# Patient Record
Sex: Female | Born: 1965 | Race: Black or African American | Hispanic: No | Marital: Married | Smoking: Never smoker
Health system: Southern US, Community
[De-identification: ages and names within clinical notes are randomized; demographics above are authoritative.]

## PROBLEM LIST (undated history)

## (undated) DIAGNOSIS — I10 Essential (primary) hypertension: Secondary | ICD-10-CM

## (undated) DIAGNOSIS — M797 Fibromyalgia: Secondary | ICD-10-CM

## (undated) DIAGNOSIS — G43909 Migraine, unspecified, not intractable, without status migrainosus: Secondary | ICD-10-CM

## (undated) DIAGNOSIS — J45909 Unspecified asthma, uncomplicated: Secondary | ICD-10-CM

## (undated) HISTORY — PX: NECK SURGERY: SHX720

---

## 2016-02-04 ENCOUNTER — Ambulatory Visit
Admission: RE | Admit: 2016-02-04 | Discharge: 2016-02-04 | Disposition: A | Discharge status: Home / Self Care | Payer: Disability Insurance | Source: Ambulatory Visit | Attending: Family Medicine | Admitting: Family Medicine

## 2016-02-04 ENCOUNTER — Ambulatory Visit
Admission: RE | Admit: 2016-02-04 | Discharge: 2016-02-04 | Disposition: A | Discharge status: Home / Self Care | Payer: Disability Insurance | Source: Ambulatory Visit | Attending: Internal Medicine | Admitting: Internal Medicine

## 2016-02-04 ENCOUNTER — Other Ambulatory Visit: Payer: Self-pay | Admitting: Family Medicine

## 2016-02-04 ENCOUNTER — Other Ambulatory Visit: Payer: Self-pay | Admitting: Internal Medicine

## 2016-02-04 DIAGNOSIS — G43909 Migraine, unspecified, not intractable, without status migrainosus: Secondary | ICD-10-CM | POA: Diagnosis not present

## 2016-02-04 DIAGNOSIS — G8929 Other chronic pain: Secondary | ICD-10-CM

## 2016-02-04 DIAGNOSIS — M549 Dorsalgia, unspecified: Principal | ICD-10-CM

## 2016-02-04 DIAGNOSIS — M5136 Other intervertebral disc degeneration, lumbar region: Secondary | ICD-10-CM | POA: Insufficient documentation

## 2016-02-04 DIAGNOSIS — I10 Essential (primary) hypertension: Secondary | ICD-10-CM | POA: Insufficient documentation

## 2016-02-04 DIAGNOSIS — F3281 Premenstrual dysphoric disorder: Secondary | ICD-10-CM | POA: Diagnosis not present

## 2016-02-04 DIAGNOSIS — J45909 Unspecified asthma, uncomplicated: Secondary | ICD-10-CM | POA: Insufficient documentation

## 2016-02-04 DIAGNOSIS — M5489 Other dorsalgia: Secondary | ICD-10-CM | POA: Diagnosis present

## 2016-02-04 DIAGNOSIS — M160 Bilateral primary osteoarthritis of hip: Secondary | ICD-10-CM | POA: Diagnosis not present

## 2016-03-08 ENCOUNTER — Emergency Department
Admission: EM | Admit: 2016-03-08 | Discharge: 2016-03-08 | Disposition: A | Discharge status: Home / Self Care | Payer: Medicaid Other | Attending: Emergency Medicine | Admitting: Emergency Medicine

## 2016-03-08 ENCOUNTER — Encounter: Payer: Self-pay | Admitting: Emergency Medicine

## 2016-03-08 DIAGNOSIS — J45909 Unspecified asthma, uncomplicated: Secondary | ICD-10-CM | POA: Diagnosis not present

## 2016-03-08 DIAGNOSIS — I10 Essential (primary) hypertension: Secondary | ICD-10-CM | POA: Insufficient documentation

## 2016-03-08 DIAGNOSIS — G43909 Migraine, unspecified, not intractable, without status migrainosus: Secondary | ICD-10-CM | POA: Insufficient documentation

## 2016-03-08 HISTORY — DX: Unspecified asthma, uncomplicated: J45.909

## 2016-03-08 HISTORY — DX: Essential (primary) hypertension: I10

## 2016-03-08 MED ORDER — DIPHENHYDRAMINE HCL 50 MG/ML IJ SOLN
25.0000 mg | Freq: Once | INTRAMUSCULAR | Status: AC
  Administered 2016-03-08: 25 mg via INTRAVENOUS
  Filled 2016-03-08: qty 1

## 2016-03-08 MED ORDER — MAGNESIUM SULFATE 2 GM/50ML IV SOLN
INTRAVENOUS | Status: AC
  Administered 2016-03-08: 2 g via INTRAVENOUS
  Filled 2016-03-08: qty 50

## 2016-03-08 MED ORDER — MAGNESIUM SULFATE 2 GM/50ML IV SOLN
2.0000 g | Freq: Once | INTRAVENOUS | Status: AC
  Administered 2016-03-08: 2 g via INTRAVENOUS
  Filled 2016-03-08: qty 50

## 2016-03-08 MED ORDER — KETOROLAC TROMETHAMINE 30 MG/ML IJ SOLN
30.0000 mg | Freq: Once | INTRAMUSCULAR | Status: AC
  Administered 2016-03-08: 30 mg via INTRAVENOUS
  Filled 2016-03-08: qty 1

## 2016-03-08 MED ORDER — BUTALBITAL-APAP-CAFFEINE 50-325-40 MG PO TABS: 1.0000 | ORAL_TABLET | Freq: Four times a day (QID) | ORAL | 0 refills | Status: AC | PRN

## 2016-03-08 MED ORDER — MORPHINE SULFATE (PF) 4 MG/ML IV SOLN
INTRAVENOUS | Status: AC
  Filled 2016-03-08: qty 1

## 2016-03-08 MED ORDER — SODIUM CHLORIDE 0.9 % IV SOLN
1000.0000 mL | Freq: Once | INTRAVENOUS | Status: AC
  Administered 2016-03-08: 1000 mL via INTRAVENOUS

## 2016-03-08 MED ORDER — METOCLOPRAMIDE HCL 5 MG/ML IJ SOLN
20.0000 mg | Freq: Once | INTRAVENOUS | Status: AC
  Administered 2016-03-08: 20 mg via INTRAVENOUS
  Filled 2016-03-08: qty 4

## 2016-03-08 MED ORDER — MORPHINE SULFATE (PF) 4 MG/ML IV SOLN
4.0000 mg | Freq: Once | INTRAVENOUS | Status: AC
  Administered 2016-03-08: 4 mg via INTRAVENOUS

## 2016-03-08 NOTE — ED Notes (Signed)
Pt states migraine for 2 days, hx of migraines, photophobia and nausea, pt has sunglasses on and icepacks

## 2016-03-08 NOTE — ED Provider Notes (Signed)
[facility] Emergency Department Provider Note   ____________________________________________    I have reviewed the triage vital signs and the nursing notes.   HISTORY  Chief Complaint Migraine     HPI Tonya Hendricks is a 50 y.o. female who presents with complaints of migraine headache. Patient reports frequent headaches and today's headache is similar. She frequently requires IV medications in emergency department to feel better. She denies neurological deficits. No fevers or chills. No neck pain. She reports photophobia, mild nausea and global pounding severe headache. This is typical of her migraines her symptoms and has been occurring over the last 2 days.   Past Medical History:  Diagnosis Date  . Asthma   . Hypertension     There are no active problems to display for this patient.   History reviewed. No pertinent surgical history.  Prior to Admission medications   Medication Sig Start Date End Date Taking? Authorizing Provider  butalbital-acetaminophen-caffeine (FIORICET) [id number] MG tablet Take 1-2 tablets by mouth every 6 (six) hours as needed for headache. 03/08/16 03/08/17  Jene Everyobert Arther Heisler, MD     Allergies Aspirin; Phenergan [promethazine hcl]; and Shellfish allergy  History reviewed. No pertinent family history.  Social History Social History  Substance Use Topics  . Smoking status: Never Smoker  . Smokeless tobacco: Never Used  . Alcohol use No    Review of Systems  Constitutional: No fever/chills Eyes:Photophobia ENT: No neck pain Cardiovascular: Denies chest pain. Respiratory: Denies shortness of breath. Gastrointestinal: No abdominal pain.  No nausea, no vomiting.    Musculoskeletal: Negative for back pain. Skin: Negative for rash. Neurological: As above  10-point ROS otherwise negative.  ____________________________________________   PHYSICAL EXAM:  VITAL SIGNS: ED Triage Vitals  Enc Vitals Group   BP 03/08/16 1605 (!) 138/100     Pulse Rate 03/08/16 1605 93     Resp 03/08/16 1605 18     Temp 03/08/16 1605 98 F (36.7 C)     Temp Source 03/08/16 1605 Oral     SpO2 03/08/16 1605 98 %     Weight 03/08/16 1606 250 lb (113.4 kg)     Height 03/08/16 1606 5\' 1"  (1.549 m)     Head Circumference --      Peak Flow --      Pain Score 03/08/16 1606 8     Pain Loc --      Pain Edu? --      Excl. in GC? --     Constitutional: Alert and oriented. No acute distressBut uncomfortable, eyes covered  Head: Atraumatic.  Mouth/Throat: Mucous membranes are moist.   Neck:  Painless ROM, no meningismus or vertebral tenderness to palpation Cardiovascular: Normal rate, regular rhythm.  Respiratory: Normal respiratory effort.  No retractions.  Gastrointestinal: Soft and nontender. No distention.   Genitourinary: deferred Musculoskeletal: No lower extremity tenderness nor edema.  Warm and well perfused Neurologic:  Normal speech and language. No gross focal neurologic deficits are appreciated.  Skin:  Skin is warm, dry and intact. No rash noted. Psychiatric: Mood and affect are normal. Speech and behavior are normal.  ____________________________________________   LABS (all labs ordered are listed, but only abnormal results are displayed)  Labs Reviewed - No data to display ____________________________________________  EKG  None ____________________________________________  RADIOLOGY  None ____________________________________________   PROCEDURES  Procedure(s) performed: No    Critical Care performed: No ____________________________________________   INITIAL IMPRESSION / ASSESSMENT AND PLAN / ED COURSE  Pertinent labs &  imaging results that were available during my care of the patient were reviewed by me and considered in my medical decision making (see chart for details).  Patient presents with what she describes as her typical migraine symptoms. She is neurologically  intact. We will treat with Benadryl, Toradol, Reglan IV as well as IV fluids and reevaluate.  Clinical Course  Patient had significant improvement but continues to have a 6/7 out of 10 headache, we will try IV magnesium.  Minimal improvement from IV magnesium, patient reports excess in the past with morphine, we will try 4 mg  Patient feeling significant better after morphine, she is anxious to go home and sleep which she thinks will make her headache away fully  she knows to return if any change in her symptoms ____________________________________________   FINAL CLINICAL IMPRESSION(S) / ED DIAGNOSES  Final diagnoses:  Migraine without status migrainosus, not intractable, unspecified migraine type      NEW MEDICATIONS STARTED DURING THIS VISIT:  Discharge Medication List as of 03/08/2016  8:25 PM    START taking these medications   Details  butalbital-acetaminophen-caffeine (FIORICET) 50-325-40 MG tablet Take 1-2 tablets by mouth every 6 (six) hours as needed for headache., Starting Sun 03/08/2016, Until Mon 03/08/2017, Print         Note:  This document was prepared using Dragon voice recognition software and may include unintentional dictation errors.    Jene Every, MD 03/08/16 2102

## 2016-03-08 NOTE — ED Triage Notes (Signed)
Pt presents to ED with c/o migraine. Pt states migraine started about 2-3 days ago. Pt c/o light sensitivity. Pt states when she goes to ED in [place] she has to get shots or a bolus of fluids.

## 2016-10-02 ENCOUNTER — Emergency Department
Admission: EM | Admit: 2016-10-02 | Discharge: 2016-10-03 | Disposition: A | Discharge status: Home / Self Care | Payer: Medicaid Other | Attending: Emergency Medicine | Admitting: Emergency Medicine

## 2016-10-02 ENCOUNTER — Emergency Department: Payer: Medicaid Other

## 2016-10-02 DIAGNOSIS — I10 Essential (primary) hypertension: Secondary | ICD-10-CM | POA: Diagnosis not present

## 2016-10-02 DIAGNOSIS — K8050 Calculus of bile duct without cholangitis or cholecystitis without obstruction: Secondary | ICD-10-CM | POA: Insufficient documentation

## 2016-10-02 DIAGNOSIS — J45909 Unspecified asthma, uncomplicated: Secondary | ICD-10-CM | POA: Insufficient documentation

## 2016-10-02 DIAGNOSIS — Z79899 Other long term (current) drug therapy: Secondary | ICD-10-CM | POA: Insufficient documentation

## 2016-10-02 DIAGNOSIS — Z7982 Long term (current) use of aspirin: Secondary | ICD-10-CM | POA: Diagnosis not present

## 2016-10-02 DIAGNOSIS — R079 Chest pain, unspecified: Secondary | ICD-10-CM | POA: Diagnosis present

## 2016-10-02 DIAGNOSIS — K8080 Other cholelithiasis without obstruction: Secondary | ICD-10-CM

## 2016-10-02 HISTORY — DX: Fibromyalgia: M79.7

## 2016-10-02 LAB — BASIC METABOLIC PANEL
ANION GAP: 7 (ref 5–15)
BUN: 13 mg/dL (ref 6–20)
CALCIUM: 9.3 mg/dL (ref 8.9–10.3)
CO2: 24 mmol/L (ref 22–32)
CREATININE: 0.71 mg/dL (ref 0.44–1.00)
Chloride: 106 mmol/L (ref 101–111)
GFR calc Af Amer: >60 mL/min (ref >60)
Glucose, Bld: 86 mg/dL (ref 65–99)
Potassium: 3.8 mmol/L (ref 3.5–5.1)
SODIUM: 137 mmol/L (ref 135–145)

## 2016-10-02 LAB — HEPATIC FUNCTION PANEL
ALK PHOS: 58 U/L (ref 38–126)
ALT: 14 U/L (ref 14–54)
AST: 19 U/L (ref 15–41)
Albumin: 3.7 g/dL (ref 3.5–5.0)
TOTAL PROTEIN: 7.7 g/dL (ref 6.5–8.1)
Total Bilirubin: 0.5 mg/dL (ref 0.3–1.2)

## 2016-10-02 LAB — TROPONIN I: Troponin I: <0.03 ng/mL (ref <0.03)

## 2016-10-02 LAB — CBC
HCT: 43 % (ref 35.0–47.0)
Hemoglobin: 14.3 g/dL (ref 12.0–16.0)
MCH: 27.5 pg (ref 26.0–34.0)
MCHC: 33.3 g/dL (ref 32.0–36.0)
MCV: 82.6 fL (ref 80.0–100.0)
PLATELETS: 170 10*3/uL (ref 150–440)
RBC: 5.21 MIL/uL — AB (ref 3.80–5.20)
RDW: 15.4 % — ABNORMAL HIGH (ref 11.5–14.5)
WBC: 9.5 10*3/uL (ref 3.6–11.0)

## 2016-10-02 LAB — LIPASE, BLOOD: Lipase: 29 U/L (ref 11–51)

## 2016-10-02 MED ORDER — MORPHINE SULFATE (PF) 4 MG/ML IV SOLN
4.0000 mg | Freq: Once | INTRAVENOUS | Status: AC
  Administered 2016-10-02: 4 mg via INTRAMUSCULAR

## 2016-10-02 MED ORDER — DIPHENHYDRAMINE HCL 25 MG PO CAPS
ORAL_CAPSULE | ORAL | Status: DC
Start: 2016-10-02 — End: 2016-10-03
  Filled 2016-10-02: qty 1

## 2016-10-02 MED ORDER — OXYCODONE-ACETAMINOPHEN 5-325 MG PO TABS: 1.0000 | ORAL_TABLET | ORAL | 0 refills | Status: DC | PRN

## 2016-10-02 MED ORDER — MORPHINE SULFATE (PF) 4 MG/ML IV SOLN
4.0000 mg | Freq: Once | INTRAVENOUS | Status: DC
Start: 2016-10-02 — End: 2016-10-02
  Filled 2016-10-02: qty 1

## 2016-10-02 MED ORDER — DIPHENHYDRAMINE HCL 25 MG PO CAPS
25.0000 mg | ORAL_CAPSULE | Freq: Once | ORAL | Status: AC
  Administered 2016-10-02: 25 mg via ORAL

## 2016-10-02 MED ORDER — ONDANSETRON HCL 4 MG PO TABS: 4.0000 mg | ORAL_TABLET | Freq: Three times a day (TID) | ORAL | 0 refills | Status: AC | PRN

## 2016-10-02 MED ORDER — OXYCODONE-ACETAMINOPHEN 5-325 MG PO TABS
1.0000 | ORAL_TABLET | Freq: Once | ORAL | Status: AC
  Administered 2016-10-02: 1 via ORAL
  Filled 2016-10-02: qty 1

## 2016-10-02 MED ORDER — MORPHINE SULFATE (PF) 4 MG/ML IV SOLN
INTRAVENOUS | Status: AC
  Administered 2016-10-02: 4 mg via INTRAMUSCULAR
  Filled 2016-10-02: qty 1

## 2016-10-02 NOTE — ED Provider Notes (Addendum)
[facility] Emergency Department Provider Note  ____________________________________________  7 PM  I have reviewed the triage vital signs and the nursing notes.   HISTORY  Chief Complaint Chest Pain   HPI Tonya Hendricks is a 51 y.o. female here for evaluation of chest pain  2 days of chest pain. Somewhat worse with inspiration, walking and especially movement and twisting. Primarily mid chest with radiation towards the right side. No nausea or vomiting. No fevers or chills. No abdominal pain. No worsening after eating. Pain is been steadily worsening for the last day. Denies previous known cardiac history, who reports for a while she was having regular testing done because of exposure to Fen-Fen.  No leg swelling. No history of blood clots. Does not smoke. Does not take any estrogens. Denies pregnancy, prior endometrial ablation. No recent trips or travel. No recent surgeries.  No ripping tearing or moving pain. No pain into the back, pain does seem to slightly radiate across the front chest toward her right shoulder at times. Feels slightly short of breath when the pain "takes her breath away" but denies feeling like she is actually short of breath.  Patient has been taken baby aspirin daily the last couple days to chest pain. Reports she has an aspirin allergy when she takes higher doses it causes as ulcers to appear on her fingers  Past Medical History:  Diagnosis Date  . Asthma   . Fibromyalgia   . Hypertension     There are no active problems to display for this patient.   Past Surgical History:  Procedure Laterality Date  . CESAREAN SECTION    . NECK SURGERY      Prior to Admission medications   Medication Sig Start Date End Date Taking? Authorizing Provider  albuterol (PROVENTIL HFA;VENTOLIN HFA) 108 (90 Base) MCG/ACT inhaler Inhale 1 puff into the lungs every 6 (six) hours as needed for wheezing or shortness of breath.   Yes Historical  Provider, MD  amLODipine (NORVASC) 10 MG tablet Take 10 mg by mouth daily.   Yes Historical Provider, MD  aspirin EC 81 MG tablet Take 162 mg by mouth every other day.   Yes Historical Provider, MD  butalbital-acetaminophen-caffeine (FIORICET) 50-325-40 MG tablet Take 1-2 tablets by mouth every 6 (six) hours as needed for headache. 03/08/16 03/08/17 Yes Jene Every, MD  Cholecalciferol (VITAMIN D-1000 MAX ST) 1000 units tablet Take 1,000 mg by mouth as needed. 04/05/15  Yes Historical Provider, MD  FLUoxetine (PROZAC) 20 MG capsule Take 60 mg by mouth daily.   Yes Historical Provider, MD  Fluticasone-Salmeterol (ADVAIR) 500-50 MCG/DOSE AEPB Inhale 2 puffs into the lungs daily.   Yes Historical Provider, MD  gabapentin (NEURONTIN) 300 MG capsule Take 300 mg by mouth 3 (three) times daily as needed.   Yes Historical Provider, MD  hydrOXYzine (ATARAX/VISTARIL) 25 MG tablet Take 25 mg by mouth 3 (three) times daily as needed. 09/13/13  Yes Historical Provider, MD  naproxen (NAPROSYN) 500 MG tablet Take 500 mg by mouth 2 (two) times daily with a meal.   Yes Historical Provider, MD  omeprazole (PRILOSEC) 20 MG capsule Take 20 mg by mouth as needed.   Yes Historical Provider, MD  ranitidine (ZANTAC) 150 MG tablet Take 150 mg by mouth as needed for heartburn.   Yes Historical Provider, MD    Allergies Aspirin; Phenergan [promethazine hcl]; and Shellfish allergy  No family history on file.  Social History Social History  Substance Use Topics  .  Smoking status: Never Smoker  . Smokeless tobacco: Never Used  . Alcohol use No    Review of Systems Constitutional: No fever/chills Eyes: No visual changes. ENT: No sore throat. Cardiovascular: Denies chest pain. Respiratory: Denies shortness of breath. Gastrointestinal: No abdominal pain.  No nausea, no vomiting.  No diarrhea.  No constipation. Genitourinary: Negative for dysuria. Musculoskeletal: Negative for back pain. Skin: Negative for  rash. Neurological: Negative for headaches, focal weakness or numbness.  10-point ROS otherwise negative.  ____________________________________________   PHYSICAL EXAM:  VITAL SIGNS: ED Triage Vitals  Enc Vitals Group     BP 10/02/16 1846 (!) 162/90     Pulse Rate 10/02/16 1842 90     Resp 10/02/16 1842 18     Temp 10/02/16 1842 98.1 F (36.7 C)     Temp Source 10/02/16 1842 Oral     SpO2 10/02/16 1842 100 %     Weight 10/02/16 1843 264 lb (119.7 kg)     Height 10/02/16 1843  (1.549 m)     Head Circumference --      Peak Flow --      Pain Score 10/02/16 1842 6     Pain Loc --      Pain Edu? --      Excl. in GC? --     Constitutional: Alert and oriented. Well appearing and in no acute distress.Very pleasant. Eyes: Conjunctivae are normal. PERRL. EOMI. Head: Atraumatic. Nose: No congestion/rhinnorhea. Mouth/Throat: Mucous membranes are moist.  Oropharynx non-erythematous. Neck: No stridor.   Cardiovascular: Normal rate, regular rhythm. Grossly normal heart sounds.  Good peripheral circulation.Patient has mild tenderness to palpation across the right anterior chest wall. Respiratory: Normal respiratory effort.  No retractions. Lungs CTAB. Gastrointestinal: Soft and nontender. Negative Murphy No distention. Somewhat obese. Musculoskeletal: No lower extremity tenderness nor edema.   Neurologic:  Normal speech and language. No gross focal neurologic deficits are appreciated. No gait instability. Skin:  Skin is warm, dry and intact. No rash noted. Psychiatric: Mood and affect are normal. Speech and behavior are normal.  ____________________________________________   LABS (all labs ordered are listed, but only abnormal results are displayed)  Labs Reviewed  CBC - Abnormal; Notable for the following:       Result Value   RBC 5.21 (*)    RDW 15.4 (*)    All other components within normal limits  HEPATIC FUNCTION PANEL - Abnormal; Notable for the following:     Bilirubin, Direct <0.1 (*)    All other components within normal limits  BASIC METABOLIC PANEL  TROPONIN I  LIPASE, BLOOD  TROPONIN I   ____________________________________________  EKG  Reviewed injury by me at 1840 Ventricular rate 90 QRS 70 QTc 440 Normal sinus rhythm, minimal T-wave abnormality Versus slight artifact including a slight depression is noted in aVF, some nonspecific flattening in V2. No clear evidence of ischemia. ____________________________________________  RADIOLOGY  Dg Chest 2 View  Result Date: 10/02/2016 CLINICAL DATA:  Epigastric pain and chest pain radiating to the right shoulder EXAM: CHEST  2 VIEW COMPARISON:  None. FINDINGS: The heart size and mediastinal contours are within normal limits. Both lungs are clear. The visualized skeletal structures are unremarkable. IMPRESSION: No active cardiopulmonary disease. Electronically Signed   By: Deatra Robinson M.D.   On: 10/02/2016 20:07    ____________________________________________   PROCEDURES  Procedure(s) performed: None  Procedures  Critical Care performed: No  ____________________________________________   INITIAL IMPRESSION / ASSESSMENT AND PLAN / ED COURSE  Pertinent labs & imaging results that were available during my care of the patient were reviewed by me and considered in my medical decision making (see chart for details).  Patient admits for evaluation of right-sided chest discomfort, some substernal component, but also a slight pleuritic and very musculoskeletal component that anytime she goes to move she reports discomfort across the right side of the chest.        Pulmonary Embolism Rule-out Criteria (PERC rule)                        If YES to ANY of the following, the PERC rule is not satisfied and cannot be used to rule out PE in this patient (consider d-dimer or imaging depending on pre-test probability).                      If NO to ALL of the following, AND the  clinician's pre-test probability is <15%, the [facility] rule is satisfied and there is no need for further workup (including no need to obtain a d-dimer) as the post-test probability of pulmonary embolism is <2%.                      Mnemonic is HAD CLOTS   H - hormone use (exogenous estrogen)      No. A - age > 50                                                 No. D - DVT/PE history                                      No.   C - coughing blood (hemoptysis)                 No. L - leg swelling, unilateral                             No. O - O2 Sat on Room Air < 95%                  No. T - tachycardia (HR ? 100)                         No. S - surgery or trauma, recent                      No.   Based on my evaluation of the patient, including application of this decision instrument, further testing to evaluate for pulmonary embolism is not indicated at this time.   ----------------------------------------- 7:29 PM on 10/02/2016 -----------------------------------------  Patient troponin negative.  ----------------------------------------- 8:36 PM on 10/02/2016 -----------------------------------------  Patient reports pain improving. Resting comfortably. I did discuss and offered admission to "rule out heart attack", after reviewing though the patient reports that for blood work is normal she was to be discharged and will follow-up. Otherwise did recommend observation, I think this is reasonable and we spent time discussing her goals of care, desire to be at home, and plan for close follow-up and return of clear full return precautions. She has  had symptoms for 2 days nurse troponin is negative I find it highly likely this represents acute coronary syndrome, and her EKG does not appear acutely ischemic. Did discuss with her husband, both are comfortable with plan for discharge of second troponin negative. In addition given her right sided discomfort all [place] quadrant is been  ordered  Plan of care assigned to Dr. Derrill Kay, likely discharge to home with close cardiology follow-up if right upper quadrant ultrasound and second troponin are normal.  ____________________________________________   FINAL CLINICAL IMPRESSION(S) / ED DIAGNOSES  Final diagnoses:  Right-sided chest pain      NEW MEDICATIONS STARTED DURING THIS VISIT:  New Prescriptions   No medications on file     Note:  This document was prepared using Dragon voice recognition software and may include unintentional dictation errors.     Sharyn Creamer, MD 10/02/16 2038    Sharyn Creamer, MD 10/02/16 2125

## 2016-10-02 NOTE — ED Notes (Signed)
Patient transported to Ultrasound 

## 2016-10-02 NOTE — Discharge Instructions (Addendum)
Please seek medical attention for any high fevers, chest pain, shortness of breath, change in behavior, persistent vomiting, bloody stool or any other new or concerning symptoms.  

## 2016-10-02 NOTE — ED Notes (Signed)
Pt. States epigastric pain for the past couple days.  Pt. States rt. Side chest pain that radiates to rt. Shoulder.

## 2016-10-02 NOTE — ED Triage Notes (Addendum)
Pt reports to ED w/ c/o CP and SOB.  Pt sts CP began 3 days ago, she thought that it was gas but pain worsening. Pt A/OX4, able to answer a;; questions w/o issue.  Pt ambulatory w/o issue.  Pt reports +PO intake. NAD. Pt sts pain radiates to back and R arm, feels "like my lungs are hurting"

## 2016-10-02 NOTE — ED Provider Notes (Signed)
-----------------------------------------   11:50 PM on 10/02/2016 -----------------------------------------  RUQ US IMPRESSION: Moderate cholelithiasis. No additional sonographic evidence to suggest cholecystitis.  2nd troponin negative. Will trial PO and oral pain medication. Will give surgery follow up.   Phineas Semen, MD 10/02/16 [phone]

## 2016-10-03 NOTE — ED Notes (Signed)
Pt. Going home with family. 

## 2016-10-06 ENCOUNTER — Telehealth: Payer: Self-pay

## 2016-10-06 NOTE — Telephone Encounter (Signed)
Lmov for patient to call back she was seen in ED on 10/02/16 for CP  °Will try again at a later time  °

## 2016-10-09 NOTE — Telephone Encounter (Signed)
Called patient she states she will follow back up with her other cardiologist

## 2016-11-02 ENCOUNTER — Inpatient Hospital Stay: Payer: Self-pay | Admitting: Surgery

## 2017-02-13 ENCOUNTER — Emergency Department
Admission: EM | Admit: 2017-02-13 | Discharge: 2017-02-13 | Disposition: A | Discharge status: Home / Self Care | Payer: Medicaid Other | Attending: Emergency Medicine | Admitting: Emergency Medicine

## 2017-02-13 DIAGNOSIS — J45909 Unspecified asthma, uncomplicated: Secondary | ICD-10-CM | POA: Insufficient documentation

## 2017-02-13 DIAGNOSIS — G43009 Migraine without aura, not intractable, without status migrainosus: Secondary | ICD-10-CM

## 2017-02-13 DIAGNOSIS — Z79899 Other long term (current) drug therapy: Secondary | ICD-10-CM | POA: Diagnosis not present

## 2017-02-13 DIAGNOSIS — I10 Essential (primary) hypertension: Secondary | ICD-10-CM | POA: Insufficient documentation

## 2017-02-13 DIAGNOSIS — G43909 Migraine, unspecified, not intractable, without status migrainosus: Secondary | ICD-10-CM | POA: Diagnosis present

## 2017-02-13 HISTORY — DX: Migraine, unspecified, not intractable, without status migrainosus: G43.909

## 2017-02-13 MED ORDER — ONDANSETRON 4 MG PO TBDP
ORAL_TABLET | ORAL | Status: DC
Start: 2017-02-13 — End: 2017-02-13
  Filled 2017-02-13: qty 1

## 2017-02-13 MED ORDER — METOCLOPRAMIDE HCL 5 MG/ML IJ SOLN
10.0000 mg | Freq: Once | INTRAMUSCULAR | Status: AC
  Administered 2017-02-13: 10 mg via INTRAVENOUS
  Filled 2017-02-13: qty 2

## 2017-02-13 MED ORDER — SODIUM CHLORIDE 0.9 % IV BOLUS (SEPSIS)
1000.0000 mL | Freq: Once | INTRAVENOUS | Status: AC
  Administered 2017-02-13: 1000 mL via INTRAVENOUS

## 2017-02-13 MED ORDER — DIPHENHYDRAMINE HCL 50 MG/ML IJ SOLN
25.0000 mg | Freq: Once | INTRAMUSCULAR | Status: AC
Start: 2017-02-13 — End: 2017-02-13
  Administered 2017-02-13: 25 mg via INTRAVENOUS
  Filled 2017-02-13: qty 1

## 2017-02-13 MED ORDER — MORPHINE SULFATE (PF) 4 MG/ML IV SOLN
4.0000 mg | Freq: Once | INTRAVENOUS | Status: AC
  Administered 2017-02-13: 4 mg via INTRAVENOUS
  Filled 2017-02-13: qty 1

## 2017-02-13 MED ORDER — METOCLOPRAMIDE HCL 10 MG PO TABS: 10.0000 mg | ORAL_TABLET | Freq: Three times a day (TID) | ORAL | 0 refills | Status: DC | PRN

## 2017-02-13 MED ORDER — ONDANSETRON 4 MG PO TBDP
4.0000 mg | ORAL_TABLET | Freq: Once | ORAL | Status: AC
  Administered 2017-02-13: 4 mg via ORAL

## 2017-02-13 MED ORDER — KETOROLAC TROMETHAMINE 30 MG/ML IJ SOLN
30.0000 mg | Freq: Once | INTRAMUSCULAR | Status: AC
  Administered 2017-02-13: 30 mg via INTRAVENOUS
  Filled 2017-02-13: qty 1

## 2017-02-13 NOTE — ED Triage Notes (Signed)
Pt arrives to ER via POV for migraine X 3 days, nausea and photosensitivity, pain in neck and shoulders. Hx of migraines. Pt alert and oriented X4, active, cooperative, pt in NAD. RR even and unlabored, color WNL.

## 2017-02-13 NOTE — Discharge Instructions (Signed)
He may take the Reglan as needed for nausea or recurrent headache at home in addition to her normal medications. Follow-up with your regular doctor. Return to the ER for new or worsening headache or other new or worsening symptoms that concern you.

## 2017-02-13 NOTE — ED Provider Notes (Signed)
[facility] Emergency Department Provider Note ____________________________________________   First MD Initiated Contact with Patient 02/13/17 1520     (approximate)  I have reviewed the triage vital signs and the nursing notes.   HISTORY  Chief Complaint Migraine    HPI Tonya Hendricks is a 51 y.o. female with history of migraines, hypertension, fibromyalgia, asthma, who presents with headache for 3 days, acute onset but then worsening course, primarily right-sided, throbbing, and associated with nausea and several episodes of vomiting. Patient also reports associated photophobia. She states that the headache is identical to prior migraines although somewhat more focused on the right side. No head trauma. No vision changes. Not relieved with over-the-counter medications at home.  Past Medical History:  Diagnosis Date  . Asthma   . Fibromyalgia   . Hypertension   . Migraines     There are no active problems to display for this patient.   Past Surgical History:  Procedure Laterality Date  . CESAREAN SECTION    . NECK SURGERY      Prior to Admission medications   Medication Sig Start Date End Date Taking? Authorizing Provider  amLODipine (NORVASC) 10 MG tablet Take 10 mg by mouth daily.   Yes [provider]  DULoxetine (CYMBALTA) 30 MG capsule Take 1 capsule by mouth daily. 09/07/16  Yes [provider]  hydrochlorothiazide (HYDRODIURIL) 25 MG tablet Take 1 tablet by mouth daily. 06/04/16  Yes [provider]  hydrOXYzine (ATARAX/VISTARIL) 25 MG tablet Take 25 mg by mouth 3 (three) times daily as needed. 09/13/13  Yes [provider]  lovastatin (MEVACOR) 20 MG tablet Take 20 mg by mouth daily. 12/22/16 12/22/17 Yes [provider]  magnesium oxide (MAG-OX) 400 MG tablet Take 1 tablet by mouth daily. 06/03/15  Yes [provider]  albuterol (PROVENTIL HFA;VENTOLIN HFA) 108 (90 Base) MCG/ACT inhaler  Inhale 1 puff into the lungs every 6 (six) hours as needed for wheezing or shortness of breath.    [provider]  butalbital-acetaminophen-caffeine (FIORICET) 50-325-40 MG tablet Take 1-2 tablets by mouth every 6 (six) hours as needed for headache. Patient not taking: Reported on 02/13/2017 03/08/16 03/08/17  Jene Every, MD  cyclobenzaprine (FLEXERIL) 5 MG tablet Take 5 mg by mouth 3 (three) times daily. 01/24/16   [provider]  gabapentin (NEURONTIN) 300 MG capsule Take 300 mg by mouth 3 (three) times daily as needed.    [provider]  meclizine (ANTIVERT) 12.5 MG tablet Take 12.5 mg by mouth 3 (three) times daily. 01/24/16   [provider]  metoCLOPramide (REGLAN) 10 MG tablet Take 1 tablet (10 mg total) by mouth every 8 (eight) hours as needed for nausea or vomiting. 02/13/17 02/23/17  Dionne Bucy, MD  omeprazole (PRILOSEC) 20 MG capsule Take 20 mg by mouth as needed.    [provider]  ondansetron (ZOFRAN) 4 MG tablet Take 1 tablet (4 mg total) by mouth every 8 (eight) hours as needed for nausea or vomiting. Patient not taking: Reported on 02/13/2017 10/02/16   Phineas Semen, MD  oxyCODONE-acetaminophen (ROXICET) 5-325 MG tablet Take 1 tablet by mouth every 4 (four) hours as needed for severe pain. Patient not taking: Reported on 02/13/2017 10/02/16   Phineas Semen, MD  ranitidine (ZANTAC) 150 MG tablet Take 150 mg by mouth as needed for heartburn.    [provider]    Allergies Promethazine; Shellfish-derived products; Sulfa antibiotics; Sumatriptan succinate; Iodinated diagnostic agents; Lisinopril; Phenergan [promethazine hcl]; Shellfish  allergy; Topiramate; and Aspirin  No family history on file.  Social History Social History  Substance Use Topics  . Smoking status: Never Smoker  . Smokeless tobacco: Never Used  . Alcohol use No    Review of Systems  Constitutional: No fever/chills Eyes: No visual  changes. ENT: No sore throat. Cardiovascular: Denies chest pain. Respiratory: Denies shortness of breath. Gastrointestinal: Positive for nausea and vomiting No diarrhea.  Genitourinary: Negative for dysuria.  Musculoskeletal: Negative for back pain. Skin: Negative for rash. Neurological: Positive for headache, negative for focal weakness or numbness.   ____________________________________________   PHYSICAL EXAM:  VITAL SIGNS: ED Triage Vitals [02/13/17 1233]  Enc Vitals Group     BP (!) 156/95     Pulse Rate 88     Resp 18     Temp 98.4 F (36.9 C)     Temp Source Oral     SpO2 97 %     Weight 264 lb (119.7 kg)     Height 5\' 1"  (1.549 m)     Head Circumference      Peak Flow      Pain Score 9     Pain Loc      Pain Edu?      Excl. in GC?     Constitutional: Alert and oriented. Well appearing and in no acute distress. Eyes: Conjunctivae are normal. EOMI. PERRLA. Head: Atraumatic. Nose: No congestion/rhinnorhea. Mouth/Throat: Mucous membranes are moist.   Neck: Normal range of motion. No meningeal signs. Cardiovascular:   Good peripheral circulation. Respiratory: Normal respiratory effort.  No retractions.  Gastrointestinal: No distention.  Genitourinary: No CVA tenderness. Musculoskeletal: No lower extremity edema.  Extremities warm and well perfused.  Neurologic:  Normal speech and language. No gross focal neurologic deficits are appreciated. Renal nerves III through XII intact, normal coordination and finger-to-nose. Skin:  Skin is warm and dry. No rash noted. Psychiatric: Mood and affect are normal. Speech and behavior are normal.  ____________________________________________   LABS (all labs ordered are listed, but only abnormal results are displayed)  Labs Reviewed - No data to  display ____________________________________________  EKG   ____________________________________________  RADIOLOGY    ____________________________________________   PROCEDURES  Procedure(s) performed: No    Critical Care performed: No ____________________________________________   INITIAL IMPRESSION / ASSESSMENT AND PLAN / ED COURSE  Pertinent labs & imaging results that were available during my care of the patient were reviewed by me and considered in my medical decision making (see chart for details).  51 year old female with history of migraines presents with headache for 3 days worsening course identical to prior migraines. Not relieved with over-the-counter meds at home. While signs are unremarkable except for slight hypertension, and neuro exam is normal. Presentation consistent with patient's chronic migraines. Plan for symptomatic treatment with fluids Reglan and Toradol and Benadryl and reassess.    ----------------------------------------- 7:10 PM on 02/13/2017 -----------------------------------------  Patient had partial relief with Reglan and Toradol but reported persistent headache. Now after small dose of IV morphine and she feels significantly better. Photophobia is resolved and patient feels well to go home. Return precautions given.  ____________________________________________   FINAL CLINICAL IMPRESSION(S) / ED DIAGNOSES  Final diagnoses:  Migraine without aura and without status migrainosus, not intractable      NEW MEDICATIONS STARTED DURING THIS VISIT:  Discharge Medication List as of 02/13/2017  7:12 PM    START taking these medications   Details  metoCLOPramide (REGLAN) 10 MG tablet Take 1 tablet (10  mg total) by mouth every 8 (eight) hours as needed for nausea or vomiting., Starting Sat 02/13/2017, Until Tue 02/23/2017, Print         Note:  This document was prepared using Dragon voice recognition software and may include  unintentional dictation errors.    Dionne Bucy, MD 02/13/17 2133

## 2017-10-01 ENCOUNTER — Ambulatory Visit: Payer: Medicaid Other | Admitting: Physical Therapy

## 2017-10-11 ENCOUNTER — Encounter: Payer: Self-pay | Admitting: Physical Therapy

## 2017-10-13 ENCOUNTER — Encounter: Payer: Medicaid Other | Admitting: Physical Therapy

## 2017-10-19 ENCOUNTER — Encounter: Payer: Medicaid Other | Admitting: Physical Therapy

## 2017-10-26 ENCOUNTER — Ambulatory Visit: Payer: Medicaid Other | Admitting: Physical Therapy

## 2017-10-26 ENCOUNTER — Encounter: Payer: Medicaid Other | Admitting: Physical Therapy

## 2017-11-01 ENCOUNTER — Encounter: Payer: Medicaid Other | Admitting: Physical Therapy

## 2017-11-02 ENCOUNTER — Encounter: Payer: Medicaid Other | Admitting: Physical Therapy

## 2017-11-03 ENCOUNTER — Encounter: Payer: Medicaid Other | Admitting: Physical Therapy

## 2018-04-20 ENCOUNTER — Emergency Department: Payer: Medicaid Other

## 2018-04-20 ENCOUNTER — Other Ambulatory Visit: Payer: Self-pay

## 2018-04-20 ENCOUNTER — Emergency Department
Admission: EM | Admit: 2018-04-20 | Discharge: 2018-04-20 | Disposition: A | Discharge status: Home / Self Care | Payer: Medicaid Other | Attending: Emergency Medicine | Admitting: Emergency Medicine

## 2018-04-20 ENCOUNTER — Encounter: Payer: Self-pay | Admitting: Emergency Medicine

## 2018-04-20 DIAGNOSIS — Z79899 Other long term (current) drug therapy: Secondary | ICD-10-CM | POA: Insufficient documentation

## 2018-04-20 DIAGNOSIS — J45909 Unspecified asthma, uncomplicated: Secondary | ICD-10-CM | POA: Insufficient documentation

## 2018-04-20 DIAGNOSIS — R079 Chest pain, unspecified: Secondary | ICD-10-CM

## 2018-04-20 DIAGNOSIS — J069 Acute upper respiratory infection, unspecified: Secondary | ICD-10-CM | POA: Diagnosis not present

## 2018-04-20 DIAGNOSIS — I10 Essential (primary) hypertension: Secondary | ICD-10-CM | POA: Diagnosis not present

## 2018-04-20 LAB — CBC WITH DIFFERENTIAL/PLATELET
Abs Immature Granulocytes: 0.03 10*3/uL (ref 0.00–0.07)
BASOS ABS: 0.1 10*3/uL (ref 0.0–0.1)
BASOS PCT: 1 %
EOS ABS: 0.3 10*3/uL (ref 0.0–0.5)
EOS PCT: 3 %
HEMATOCRIT: 39.8 % (ref 36.0–46.0)
Hemoglobin: 13.1 g/dL (ref 12.0–15.0)
IMMATURE GRANULOCYTES: 0 %
LYMPHS ABS: 3 10*3/uL (ref 0.7–4.0)
Lymphocytes Relative: 35 %
MCH: 27.3 pg (ref 26.0–34.0)
MCHC: 32.9 g/dL (ref 30.0–36.0)
MCV: 83.1 fL (ref 80.0–100.0)
Monocytes Absolute: 0.5 10*3/uL (ref 0.1–1.0)
Monocytes Relative: 6 %
NEUTROS ABS: 4.8 10*3/uL (ref 1.7–7.7)
NEUTROS PCT: 55 %
NRBC: 0 % (ref 0.0–0.2)
PLATELETS: 165 10*3/uL (ref 150–400)
RBC: 4.79 MIL/uL (ref 3.87–5.11)
RDW: 16.1 % — AB (ref 11.5–15.5)
WBC: 8.6 10*3/uL (ref 4.0–10.5)

## 2018-04-20 LAB — COMPREHENSIVE METABOLIC PANEL
ALBUMIN: 3.4 g/dL — AB (ref 3.5–5.0)
ALT: 17 U/L (ref 0–44)
AST: 14 U/L — AB (ref 15–41)
Alkaline Phosphatase: 72 U/L (ref 38–126)
Anion gap: 4 — ABNORMAL LOW (ref 5–15)
BILIRUBIN TOTAL: 0.5 mg/dL (ref 0.3–1.2)
BUN: 13 mg/dL (ref 6–20)
CHLORIDE: 104 mmol/L (ref 98–111)
CO2: 30 mmol/L (ref 22–32)
Calcium: 8.8 mg/dL — ABNORMAL LOW (ref 8.9–10.3)
Creatinine, Ser: 0.67 mg/dL (ref 0.44–1.00)
GFR calc Af Amer: >60 mL/min (ref >60)
GFR calc non Af Amer: >60 mL/min (ref >60)
GLUCOSE: 118 mg/dL — AB (ref 70–99)
POTASSIUM: 3.3 mmol/L — AB (ref 3.5–5.1)
SODIUM: 138 mmol/L (ref 135–145)
TOTAL PROTEIN: 7.1 g/dL (ref 6.5–8.1)

## 2018-04-20 LAB — TROPONIN I: Troponin I: <0.03 ng/mL (ref <0.03)

## 2018-04-20 MED ORDER — ACETAMINOPHEN 500 MG PO TABS
500.0000 mg | ORAL_TABLET | Freq: Once | ORAL | Status: AC
  Administered 2018-04-20: 500 mg via ORAL

## 2018-04-20 MED ORDER — AZITHROMYCIN 250 MG PO TABS: 250.0000 mg | ORAL_TABLET | Freq: Every day | ORAL | 0 refills | Status: DC

## 2018-04-20 MED ORDER — AZITHROMYCIN 500 MG PO TABS
500.0000 mg | ORAL_TABLET | Freq: Once | ORAL | Status: AC
  Administered 2018-04-20: 500 mg via ORAL
  Filled 2018-04-20: qty 1

## 2018-04-20 MED ORDER — ACETAMINOPHEN 500 MG PO TABS
ORAL_TABLET | ORAL | Status: AC
  Filled 2018-04-20: qty 1

## 2018-04-20 NOTE — ED Notes (Signed)
ED Provider at bedside. 

## 2018-04-20 NOTE — ED Provider Notes (Signed)
[facility] Emergency Department Provider Note  Time seen: 8:00 PM  I have reviewed the triage vital signs and the nursing notes.   HISTORY  Chief Complaint Chest Pain    HPI Tonya Hendricks is a 52 y.o. female with a past medical history of fibromyalgia, hypertension, migraines, asthma, presents the emergency department for various complaints.  According to the patient over the past 2 to 3 days she has been experiencing a significant cough which she states is somewhat chronic usually productive of white or yellow sputum but is now been productive of a more brown appearing sputum and has worsened over the past 2 to 3 days.  She feels mild shortness of breath at times as well as well as a chest tightness/discomfort in her chest over the past 3 days.  States she has been having some mild pain in her left ear as well as a sore throat.  Husband states he is currently being treated for bronchitis.  Patient denies any leg pain or swelling, no nausea or diaphoresis.  Does states have a history of fibromyalgia and is having pain throughout her entire body however states this is chronic as well.   Past Medical History:  Diagnosis Date  . Asthma   . Fibromyalgia   . Hypertension   . Migraines     There are no active problems to display for this patient.   Past Surgical History:  Procedure Laterality Date  . CESAREAN SECTION    . NECK SURGERY      Prior to Admission medications   Medication Sig Start Date End Date Taking? Authorizing Provider  albuterol (PROVENTIL HFA;VENTOLIN HFA) 108 (90 Base) MCG/ACT inhaler Inhale 1 puff into the lungs every 6 (six) hours as needed for wheezing or shortness of breath.    [provider]  amLODipine (NORVASC) 10 MG tablet Take 10 mg by mouth daily.    [provider]  cyclobenzaprine (FLEXERIL) 5 MG tablet Take 5 mg by mouth 3 (three) times daily. 01/24/16   [provider]  DULoxetine (CYMBALTA) 30 MG  capsule Take 1 capsule by mouth daily. 09/07/16   [provider]  gabapentin (NEURONTIN) 300 MG capsule Take 300 mg by mouth 3 (three) times daily as needed.    [provider]  hydrochlorothiazide (HYDRODIURIL) 25 MG tablet Take 1 tablet by mouth daily. 06/04/16   [provider]  hydrOXYzine (ATARAX/VISTARIL) 25 MG tablet Take 25 mg by mouth 3 (three) times daily as needed. 09/13/13   [provider]  lovastatin (MEVACOR) 20 MG tablet Take 20 mg by mouth daily. 12/22/16 12/22/17  [provider]  magnesium oxide (MAG-OX) 400 MG tablet Take 1 tablet by mouth daily. 06/03/15   [provider]  meclizine (ANTIVERT) 12.5 MG tablet Take 12.5 mg by mouth 3 (three) times daily. 01/24/16   [provider]  metoCLOPramide (REGLAN) 10 MG tablet Take 1 tablet (10 mg total) by mouth every 8 (eight) hours as needed for nausea or vomiting. 02/13/17 02/23/17  Dionne Bucy, MD  omeprazole (PRILOSEC) 20 MG capsule Take 20 mg by mouth as needed.    [provider]  ondansetron (ZOFRAN) 4 MG tablet Take 1 tablet (4 mg total) by mouth every 8 (eight) hours as needed for nausea or vomiting. Patient not taking: Reported on 02/13/2017 10/02/16   Phineas Semen, MD  oxyCODONE-acetaminophen (ROXICET) 5-325 MG tablet Take 1 tablet by mouth every 4 (four) hours as needed for severe pain. Patient not  taking: Reported on 02/13/2017 10/02/16   Phineas Semen, MD  ranitidine (ZANTAC) 150 MG tablet Take 150 mg by mouth as needed for heartburn.    [provider]    Allergies  Allergen Reactions  . Promethazine Anaphylaxis, Hives and Other (See Comments)    Other Reaction: Other reaction  . Shellfish-Derived Products Anaphylaxis  . Sulfa Antibiotics Anaphylaxis  . Sumatriptan Succinate Nausea Only and Nausea And Vomiting  . Iodinated Diagnostic Agents Other (See Comments)    Unsure of reaction; she is allergic to shellfish therefore she was told  she is allergic to iodine. Has had IV contrast without reaction.   . Lisinopril Cough  . Phenergan [Promethazine Hcl]   . Shellfish Allergy   . Topiramate Other (See Comments)    Extremity tingling  . Aspirin Itching, Swelling and Other (See Comments)    Blisters on hands    No family history on file.  Social History Social History   Tobacco Use  . Smoking status: Never Smoker  . Smokeless tobacco: Never Used  Substance Use Topics  . Alcohol use: No  . Drug use: No    Review of Systems Constitutional: Negative for fever. ENT: Mild left ear pain.  Sore throat x3 days Cardiovascular: Intermittent chest tightness x3 days Respiratory: Cough with brown sputum Gastrointestinal: Negative for abdominal pain, vomiting Musculoskeletal: Negative for musculoskeletal complaints Skin: Negative for skin complaints  Neurological: Negative for headache All other ROS negative  ____________________________________________   PHYSICAL EXAM:  VITAL SIGNS: ED Triage Vitals  Enc Vitals Group     BP 04/20/18 1907 (!) 144/85     Pulse Rate 04/20/18 1907 76     Resp 04/20/18 1907 18     Temp 04/20/18 1907 (!) 97.5 F (36.4 C)     Temp Source 04/20/18 1907 Oral     SpO2 --      Weight 04/20/18 1904 262 lb (118.8 kg)     Height 04/20/18 1904 5\' 1"  (1.549 m)     Head Circumference --      Peak Flow --      Pain Score 04/20/18 1904 7     Pain Loc --      Pain Edu? --      Excl. in GC? --    Constitutional: Alert and oriented. Well appearing and in no distress. Eyes: Normal exam ENT   Head: Normocephalic and atraumatic.  Mild opacification of the left tympanic membrane, no bulging or erythema.  Normal right tympanic membrane.   Mouth/Throat: Mucous membranes are moist. Cardiovascular: Normal rate, regular rhythm. No murmur Respiratory: Normal respiratory effort without tachypnea nor retractions. Breath sounds are clear, without wheeze rales or rhonchi.  Chest is nontender to  palpation. Gastrointestinal: Soft and nontender. No distention.  Musculoskeletal: Nontender with normal range of motion in all extremities. No lower extremity tenderness or edema. Neurologic:  Normal speech and language. No gross focal neurologic deficits  Skin:  Skin is warm, dry and intact.  Psychiatric: Mood and affect are normal. Speech and behavior are normal.   ____________________________________________    EKG  EKG reviewed and interpreted by myself shows a normal sinus rhythm 83 bpm with a narrow QRS, normal axis, normal intervals, no concerning ST changes.  ____________________________________________    RADIOLOGY  Chest x-ray is clear  ____________________________________________   INITIAL IMPRESSION / ASSESSMENT AND PLAN / ED COURSE  Pertinent labs & imaging results that were available during my care of the patient were reviewed by me  and considered in my medical decision making (see chart for details).  Patient presents to the emergency department with symptoms of sore throat, left ear discomfort, cough with brown sputum production, mild shortness of breath as well as intermittent chest tightness over the past 3 days.  Symptoms are suggestive of an upper respiratory infection or acute bronchitis.  We will obtain lab work, and continue to closely monitor.  Reassuringly patient's chest x-ray is negative, EKG is reassuring.  Patient's lab work is largely within normal limits including negative troponin.  Given the possibility of acute bronchitis given her symptoms we will cover with Zithromax, we will dose 500 mg today and have the patient discharged 4 days of 250 mg tablets.  Patient agreeable to plan of care and will follow up with her doctor.  ____________________________________________   FINAL CLINICAL IMPRESSION(S) / ED DIAGNOSES  Upper respiratory infection Chest pain    Minna Antis, MD 04/20/18 2053

## 2018-04-20 NOTE — ED Notes (Signed)
Pt out in hallway asking for blankets, TV remote and pillows

## 2018-04-20 NOTE — ED Triage Notes (Addendum)
Patient ambulatory to triage with steady gait, without difficulty or distress noted; pt reports SHOB and left sided chest tightness last several days; st hx of same with fibromyalgia and pneumonia; also reports recent productive cough

## 2018-04-25 ENCOUNTER — Encounter: Payer: Self-pay | Admitting: Emergency Medicine

## 2018-04-25 ENCOUNTER — Other Ambulatory Visit: Payer: Self-pay

## 2018-04-25 ENCOUNTER — Emergency Department
Admission: EM | Admit: 2018-04-25 | Discharge: 2018-04-25 | Disposition: A | Discharge status: Home / Self Care | Payer: Medicaid Other | Attending: Emergency Medicine | Admitting: Emergency Medicine

## 2018-04-25 DIAGNOSIS — R51 Headache: Secondary | ICD-10-CM | POA: Diagnosis present

## 2018-04-25 DIAGNOSIS — J45909 Unspecified asthma, uncomplicated: Secondary | ICD-10-CM | POA: Diagnosis not present

## 2018-04-25 DIAGNOSIS — J0110 Acute frontal sinusitis, unspecified: Secondary | ICD-10-CM

## 2018-04-25 DIAGNOSIS — G43709 Chronic migraine without aura, not intractable, without status migrainosus: Secondary | ICD-10-CM

## 2018-04-25 DIAGNOSIS — Z79899 Other long term (current) drug therapy: Secondary | ICD-10-CM | POA: Diagnosis not present

## 2018-04-25 DIAGNOSIS — I10 Essential (primary) hypertension: Secondary | ICD-10-CM | POA: Diagnosis not present

## 2018-04-25 MED ORDER — DIPHENHYDRAMINE HCL 50 MG/ML IJ SOLN
50.0000 mg | Freq: Once | INTRAMUSCULAR | Status: AC
  Administered 2018-04-25: 50 mg via INTRAMUSCULAR
  Filled 2018-04-25: qty 1

## 2018-04-25 MED ORDER — AMOXICILLIN-POT CLAVULANATE 875-125 MG PO TABS: 1.0000 | ORAL_TABLET | Freq: Two times a day (BID) | ORAL | 0 refills | Status: DC

## 2018-04-25 MED ORDER — CETIRIZINE HCL 10 MG PO TABS: 10.0000 mg | ORAL_TABLET | Freq: Every day | ORAL | 0 refills | Status: AC

## 2018-04-25 MED ORDER — FLUTICASONE PROPIONATE 50 MCG/ACT NA SUSP: 1.0000 | Freq: Two times a day (BID) | NASAL | 0 refills | Status: AC

## 2018-04-25 MED ORDER — METOCLOPRAMIDE HCL 10 MG PO TABS
20.0000 mg | ORAL_TABLET | Freq: Once | ORAL | Status: AC
  Administered 2018-04-25: 20 mg via ORAL
  Filled 2018-04-25: qty 2

## 2018-04-25 MED ORDER — AMOXICILLIN-POT CLAVULANATE 875-125 MG PO TABS
1.0000 | ORAL_TABLET | Freq: Once | ORAL | Status: AC
  Administered 2018-04-25: 1 via ORAL
  Filled 2018-04-25: qty 1

## 2018-04-25 MED ORDER — KETOROLAC TROMETHAMINE 30 MG/ML IJ SOLN
30.0000 mg | Freq: Once | INTRAMUSCULAR | Status: AC
  Administered 2018-04-25: 30 mg via INTRAMUSCULAR
  Filled 2018-04-25: qty 1

## 2018-04-25 NOTE — ED Notes (Signed)
See triage note  Presents with migraine  States she developed a headache 3 days ago  Has tried her meds w/o relief

## 2018-04-25 NOTE — ED Provider Notes (Signed)
[facility] Emergency Department Provider Note  ____________________________________________  Time seen: Approximately 7:55 PM  I have reviewed the triage vital signs and the nursing notes.   HISTORY  Chief Complaint Migraine    HPI Tonya Hendricks is a 52 y.o. female patient presents the emergency department complaining of migraine headache x3 days.  Patient reports that she has a history of migraines, takes Topamax for same.  Typically medication relieves headache well.  Patient reports over the past several days it has not alleviated her symptoms.  Patient endorses sinus pressure, nasal congestion, postnasal drip, ear fullness.  She had a URI 7 to 10 days ago, this appeared to resolve and then sinus pressure increased.  Patient does not take any medications for allergies though she states that during this time she typically has allergic rhinitis type symptoms.  Patient has not taken any medications for URI symptoms.  Patient denies any visual changes, neck pain or stiffness, chest pain, shortness of breath, abdominal pain, nausea or vomiting.    Past Medical History:  Diagnosis Date  . Asthma   . Fibromyalgia   . Hypertension   . Migraines     There are no active problems to display for this patient.   Past Surgical History:  Procedure Laterality Date  . CESAREAN SECTION    . NECK SURGERY      Prior to Admission medications   Medication Sig Start Date End Date Taking? Authorizing Provider  albuterol (PROVENTIL HFA;VENTOLIN HFA) 108 (90 Base) MCG/ACT inhaler Inhale 1 puff into the lungs every 6 (six) hours as needed for wheezing or shortness of breath.    [provider]  amLODipine (NORVASC) 10 MG tablet Take 10 mg by mouth daily.    [provider]  amoxicillin-clavulanate (AUGMENTIN) 875-125 MG tablet Take 1 tablet by mouth 2 (two) times daily. 04/25/18   Cuthriell, Delorise Royals, PA-C  azithromycin (ZITHROMAX) 250 MG tablet Take 1  tablet (250 mg total) by mouth daily. 04/20/18   Minna Antis, MD  cetirizine (ZYRTEC) 10 MG tablet Take 1 tablet (10 mg total) by mouth daily. 04/25/18   Cuthriell, Delorise Royals, PA-C  cyclobenzaprine (FLEXERIL) 5 MG tablet Take 5 mg by mouth 3 (three) times daily. 01/24/16   [provider]  DULoxetine (CYMBALTA) 30 MG capsule Take 1 capsule by mouth daily. 09/07/16   [provider]  fluticasone (FLONASE) 50 MCG/ACT nasal spray Place 1 spray into both nostrils 2 (two) times daily. 04/25/18   Cuthriell, Delorise Royals, PA-C  gabapentin (NEURONTIN) 300 MG capsule Take 300 mg by mouth 3 (three) times daily as needed.    [provider]  hydrochlorothiazide (HYDRODIURIL) 25 MG tablet Take 1 tablet by mouth daily. 06/04/16   [provider]  hydrOXYzine (ATARAX/VISTARIL) 25 MG tablet Take 25 mg by mouth 3 (three) times daily as needed. 09/13/13   [provider]  lovastatin (MEVACOR) 20 MG tablet Take 20 mg by mouth daily. 12/22/16 12/22/17  [provider]  magnesium oxide (MAG-OX) 400 MG tablet Take 1 tablet by mouth daily. 06/03/15   [provider]  meclizine (ANTIVERT) 12.5 MG tablet Take 12.5 mg by mouth 3 (three) times daily. 01/24/16   [provider]  metoCLOPramide (REGLAN) 10 MG tablet Take 1 tablet (10 mg total) by mouth every 8 (eight) hours as needed for nausea or vomiting. 02/13/17 02/23/17  Dionne Bucy, MD  omeprazole (PRILOSEC) 20 MG capsule Take 20 mg by mouth as needed.  [provider]  ondansetron (ZOFRAN) 4 MG tablet Take 1 tablet (4 mg total) by mouth every 8 (eight) hours as needed for nausea or vomiting. Patient not taking: Reported on 02/13/2017 10/02/16   Phineas Semen, MD  oxyCODONE-acetaminophen (ROXICET) 5-325 MG tablet Take 1 tablet by mouth every 4 (four) hours as needed for severe pain. Patient not taking: Reported on 02/13/2017 10/02/16   Phineas Semen, MD  ranitidine (ZANTAC) 150 MG  tablet Take 150 mg by mouth as needed for heartburn.    [provider]    Allergies Promethazine; Shellfish-derived products; Sulfa antibiotics; Sumatriptan succinate; Iodinated diagnostic agents; Lisinopril; Phenergan [promethazine hcl]; Shellfish allergy; Topiramate; and Aspirin  History reviewed. No pertinent family history.  Social History Social History   Tobacco Use  . Smoking status: Never Smoker  . Smokeless tobacco: Never Used  Substance Use Topics  . Alcohol use: No  . Drug use: No     Review of Systems  Constitutional: No fever/chills Eyes: No visual changes. No discharge ENT: Positive for bilateral ear fullness, nasal congestion, sinus pressure, postnasal drip. Cardiovascular: no chest pain. Respiratory: no cough. No SOB. Gastrointestinal: No abdominal pain.  No nausea, no vomiting.  Musculoskeletal: Negative for musculoskeletal pain. Skin: Negative for rash, abrasions, lacerations, ecchymosis. Neurological: Positive for migraine headache but denies focal weakness or numbness. 10-point ROS otherwise negative.  ____________________________________________   PHYSICAL EXAM:  VITAL SIGNS: ED Triage Vitals  Enc Vitals Group     BP 04/25/18 1757 138/82     Pulse Rate 04/25/18 1757 80     Resp 04/25/18 1757 18     Temp 04/25/18 1757 98.2 F (36.8 C)     Temp Source 04/25/18 1757 Oral     SpO2 04/25/18 1757 95 %     Weight 04/25/18 1758 261 lb 14.5 oz (118.8 kg)     Height 04/25/18 1758 5\' 1"  (1.549 m)     Head Circumference --      Peak Flow --      Pain Score 04/25/18 1757 9     Pain Loc --      Pain Edu? --      Excl. in GC? --      Constitutional: Alert and oriented. Well appearing and in no acute distress. Eyes: Conjunctivae are normal. PERRL. EOMI. Head: Atraumatic. ENT:      Ears: EACs unremarkable bilaterally.  TMs are minimally bulging bilaterally.  No injection or air-fluid level.      Nose: Moderate congestion.  Patient with  erythematous and edematous turbinates.  Patient is tender percussion over the frontal sinuses.      Mouth/Throat: Mucous membranes are moist.  Neck: No stridor.  Neck is supple full range of motion Hematological/Lymphatic/Immunilogical: No cervical lymphadenopathy. Cardiovascular: Normal rate, regular rhythm. Normal S1 and S2.  Good peripheral circulation. Respiratory: Normal respiratory effort without tachypnea or retractions. Lungs CTAB. Good air entry to the bases with no decreased or absent breath sounds. Musculoskeletal: Full range of motion to all extremities. No gross deformities appreciated. Neurologic:  Normal speech and language. No gross focal neurologic deficits are appreciated.  Cranial nerves II through XII grossly intact.  Negative Romberg's and pronator drift. Skin:  Skin is warm, dry and intact. No rash noted. Psychiatric: Mood and affect are normal. Speech and behavior are normal. Patient exhibits appropriate insight and judgement.   ____________________________________________   LABS (all labs ordered are listed, but only abnormal results are displayed)  Labs Reviewed - No data to  display ____________________________________________  EKG   ____________________________________________  RADIOLOGY   No results found.  ____________________________________________    PROCEDURES  Procedure(s) performed:    Procedures    Medications  diphenhydrAMINE (BENADRYL) injection 50 mg (has no administration in time range)  ketorolac (TORADOL) 30 MG/ML injection 30 mg (has no administration in time range)  metoCLOPramide (REGLAN) tablet 20 mg (has no administration in time range)  amoxicillin-clavulanate (AUGMENTIN) 875-125 MG per tablet 1 tablet (has no administration in time range)     ____________________________________________   INITIAL IMPRESSION / ASSESSMENT AND PLAN / ED COURSE  Pertinent labs & imaging results that were available during my care of  the patient were reviewed by me and considered in my medical decision making (see chart for details).  Review of the [place] CSRS was performed in accordance of the [facility] prior to dispensing any controlled drugs.      Patient's diagnosis is consistent with migraine headache and sinus infection.  Patient presents the emergency department complaining of ongoing migraine that does not respond to normal medication.  Patient also endorses nasal congestion, sinus pressure, ear fullness, postnasal drip.  On exam, patient has findings consistent with sinusitis.  This likely has triggered patient's migraine.  At this time, no indication for labs or imaging.  Patient will be treated for sinusitis with Augmentin, Flonase and Zyrtec at home.  She may continue her at home medication for migraine.  Patient is given migraine cocktail emergency department.  This cocktail includes Toradol, diphenhydramine, Reglan.  Follow-up with primary care as needed. Patient is given ED precautions to return to the ED for any worsening or new symptoms.     ____________________________________________  FINAL CLINICAL IMPRESSION(S) / ED DIAGNOSES  Final diagnoses:  Chronic migraine without aura without status migrainosus, not intractable  Acute non-recurrent frontal sinusitis      NEW MEDICATIONS STARTED DURING THIS VISIT:  ED Discharge Orders         Ordered    amoxicillin-clavulanate (AUGMENTIN) 875-125 MG tablet  2 times daily     04/25/18 2008    fluticasone (FLONASE) 50 MCG/ACT nasal spray  2 times daily     04/25/18 2008    cetirizine (ZYRTEC) 10 MG tablet  Daily     04/25/18 2008              This chart was dictated using voice recognition software/Dragon. Despite best efforts to proofread, errors can occur which can change the meaning. Any change was purely unintentional.    Racheal Patches, PA-C 04/25/18 2013    Don Perking, [place], MD 04/25/18 [phone]

## 2018-04-25 NOTE — ED Triage Notes (Addendum)
Pt presents with migraine x 3 days. Pt states she has taken OTC meds as well as prescription medication with no relief. Pt reports photosensitivity as well as nausea and vomiting this morning, which has resolved. NAD noted.

## 2018-06-29 IMAGING — US US ABDOMEN LIMITED
1 series · 14 of 25 positions shown · non-contrast
Comparison: None.

CLINICAL DATA: Right-sided chest and epigastric pain radiating to
back and right shoulder 2-3 days.

EXAM:
US ABDOMEN LIMITED - RIGHT UPPER QUADRANT

[Series 1: us abdomen limited · 0.21mm/px · 14 of 48 slices shown]
[im 1/48]
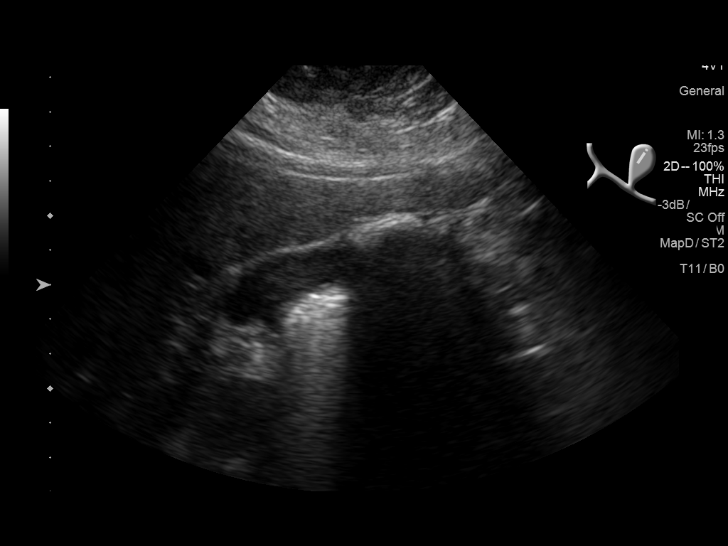
[im 4/48]
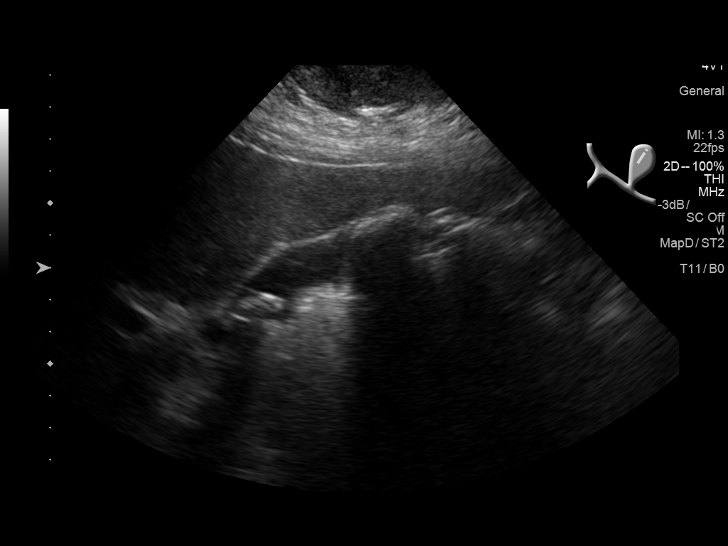
[im 8/48]
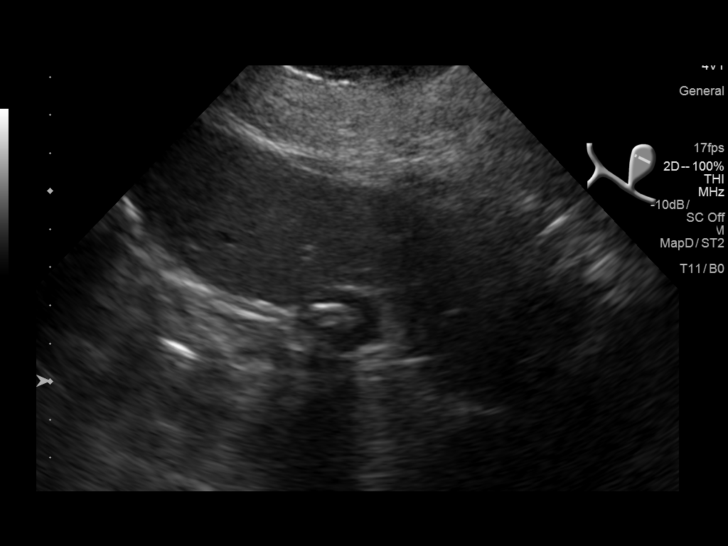
[im 12/48]
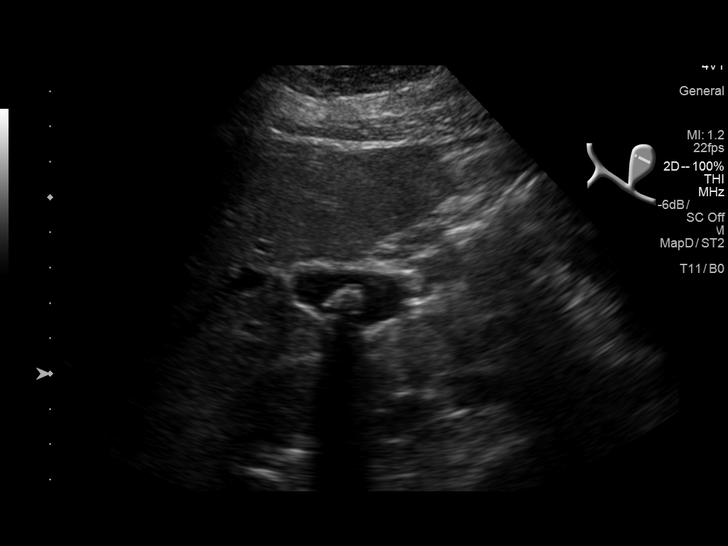
[im 16/48]
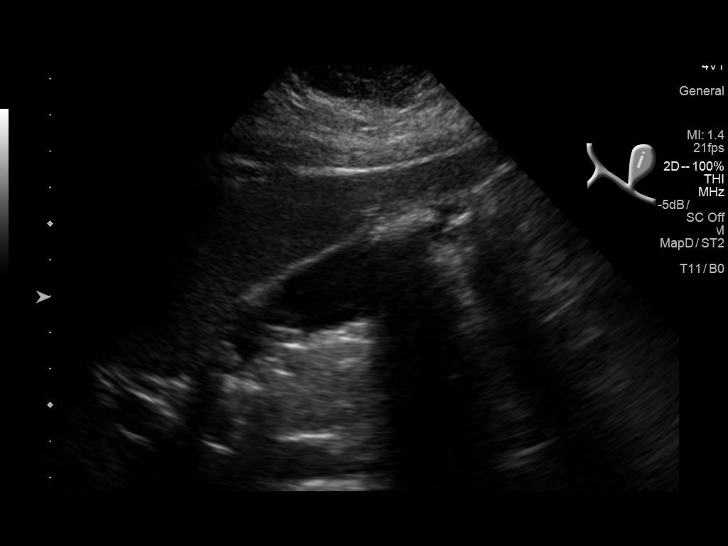
[im 18/48]
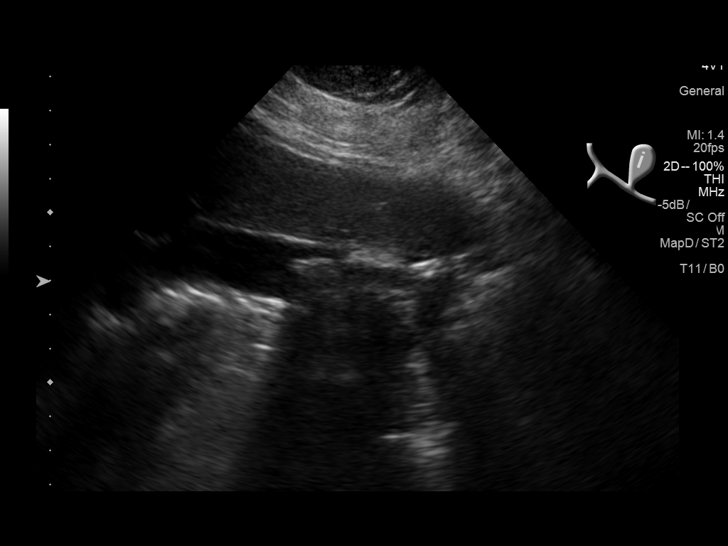
[im 22/48]
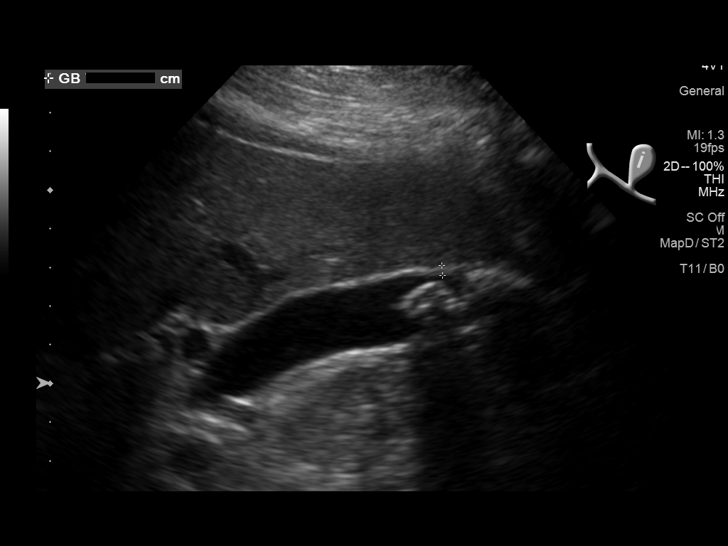
[im 26/48]
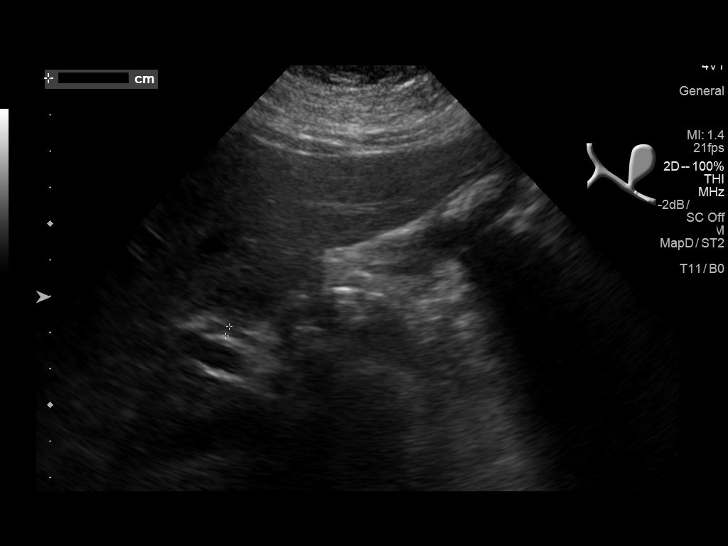
[im 30/48]
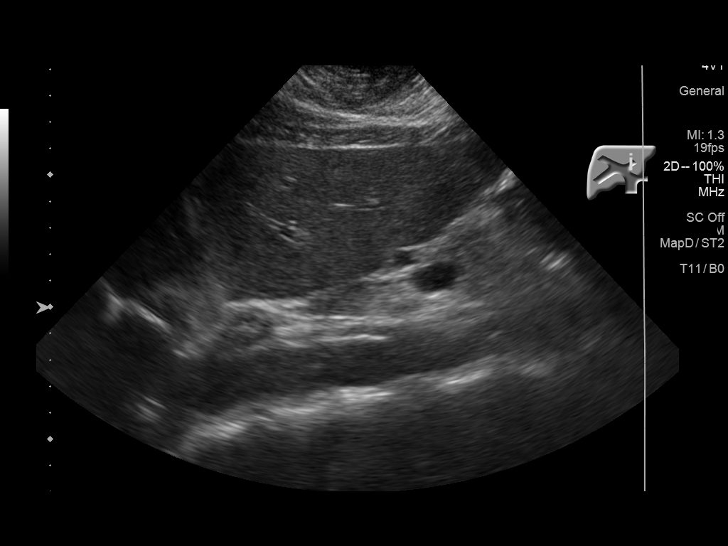
[im 32/48]
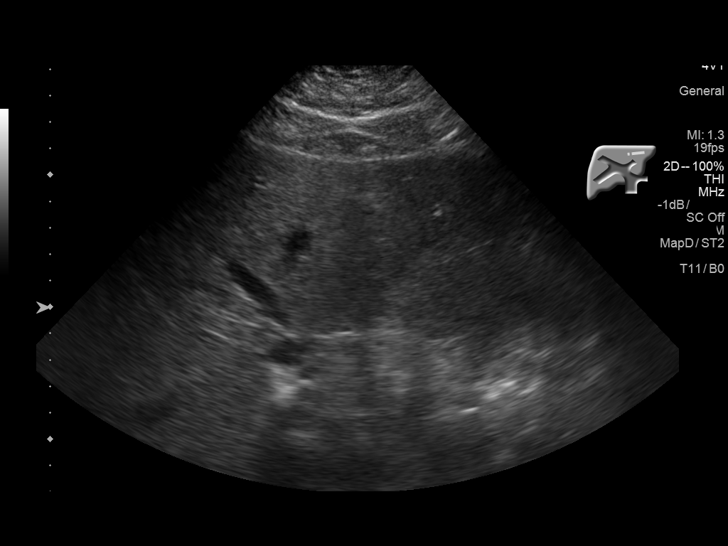
[im 36/48]
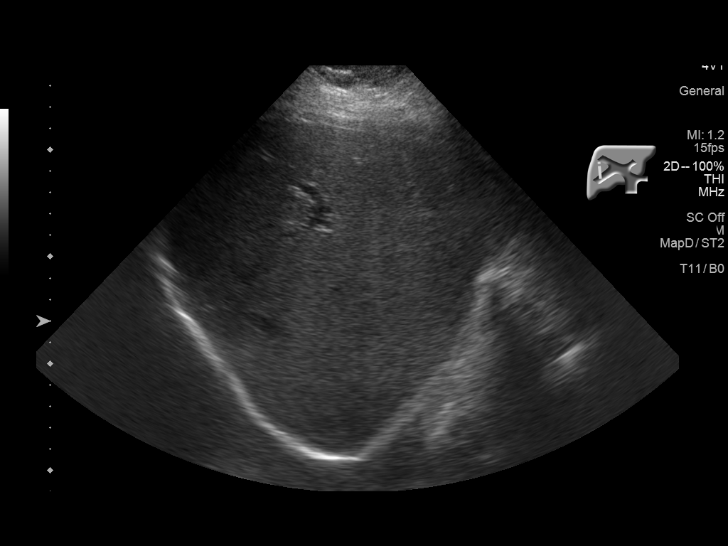
[im 40/48]
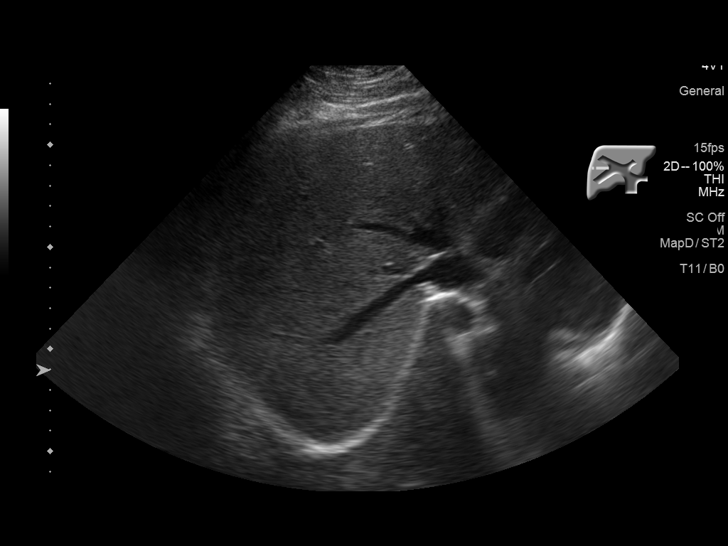
[im 44/48]
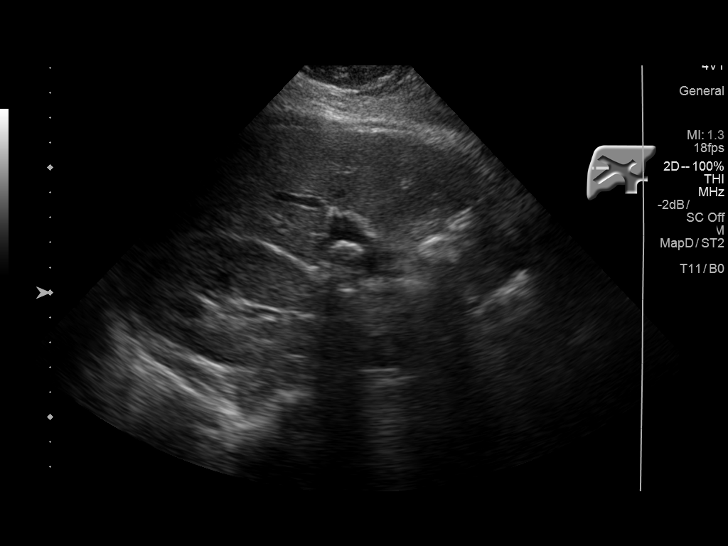
[im 48/48]
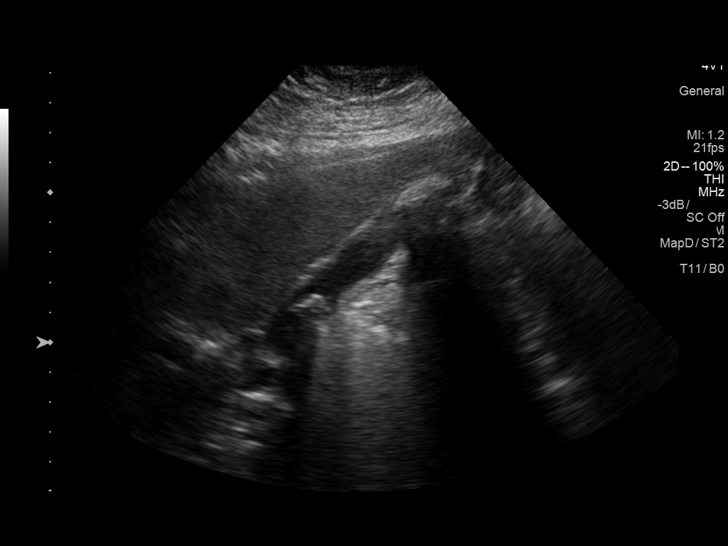

[14 of 25 positions shown; findings below may reference images not displayed]

FINDINGS: Gallbladder:

Evidence of moderate cholelithiasis with the largest stone measuring
2.1 cm. No significant gallbladder wall thickening. No adjacent
pericholecystic fluid. Negative sonographic Murphy sign.

Common bile duct:

Diameter: 2.7 mm.

Liver:

No focal lesion identified. Within normal limits in parenchymal
echogenicity.
IMPRESSION: Moderate cholelithiasis. No additional sonographic evidence to
suggest cholecystitis.

## 2018-08-11 ENCOUNTER — Other Ambulatory Visit: Payer: Self-pay

## 2018-08-11 ENCOUNTER — Emergency Department
Admission: EM | Admit: 2018-08-11 | Discharge: 2018-08-11 | Disposition: A | Discharge status: Home / Self Care | Payer: Medicaid Other | Attending: Emergency Medicine | Admitting: Emergency Medicine

## 2018-08-11 ENCOUNTER — Encounter: Payer: Self-pay | Admitting: Emergency Medicine

## 2018-08-11 DIAGNOSIS — I10 Essential (primary) hypertension: Secondary | ICD-10-CM | POA: Insufficient documentation

## 2018-08-11 DIAGNOSIS — R110 Nausea: Secondary | ICD-10-CM | POA: Insufficient documentation

## 2018-08-11 DIAGNOSIS — J45909 Unspecified asthma, uncomplicated: Secondary | ICD-10-CM | POA: Insufficient documentation

## 2018-08-11 DIAGNOSIS — Z79899 Other long term (current) drug therapy: Secondary | ICD-10-CM | POA: Diagnosis not present

## 2018-08-11 DIAGNOSIS — G43009 Migraine without aura, not intractable, without status migrainosus: Secondary | ICD-10-CM | POA: Diagnosis not present

## 2018-08-11 DIAGNOSIS — R51 Headache: Secondary | ICD-10-CM | POA: Diagnosis present

## 2018-08-11 MED ORDER — METOCLOPRAMIDE HCL 10 MG PO TABS: 10.0000 mg | ORAL_TABLET | Freq: Three times a day (TID) | ORAL | 0 refills | Status: AC | PRN

## 2018-08-11 MED ORDER — ORPHENADRINE CITRATE 30 MG/ML IJ SOLN
60.0000 mg | INTRAMUSCULAR | Status: AC
  Administered 2018-08-11: 60 mg via INTRAVENOUS
  Filled 2018-08-11: qty 2

## 2018-08-11 MED ORDER — DEXAMETHASONE SODIUM PHOSPHATE 10 MG/ML IJ SOLN
10.0000 mg | Freq: Once | INTRAMUSCULAR | Status: AC
  Administered 2018-08-11: 10 mg via INTRAVENOUS
  Filled 2018-08-11: qty 1

## 2018-08-11 MED ORDER — KETOROLAC TROMETHAMINE 30 MG/ML IJ SOLN
30.0000 mg | Freq: Once | INTRAMUSCULAR | Status: AC
  Administered 2018-08-11: 30 mg via INTRAVENOUS
  Filled 2018-08-11: qty 1

## 2018-08-11 MED ORDER — DIPHENHYDRAMINE HCL 50 MG/ML IJ SOLN
25.0000 mg | Freq: Once | INTRAMUSCULAR | Status: AC
  Administered 2018-08-11: 25 mg via INTRAVENOUS
  Filled 2018-08-11: qty 1

## 2018-08-11 MED ORDER — CYCLOBENZAPRINE HCL 5 MG PO TABS: 5.0000 mg | ORAL_TABLET | Freq: Three times a day (TID) | ORAL | 0 refills | Status: AC | PRN

## 2018-08-11 MED ORDER — KETOROLAC TROMETHAMINE 10 MG PO TABS: 10.0000 mg | ORAL_TABLET | Freq: Three times a day (TID) | ORAL | 0 refills | Status: AC

## 2018-08-11 MED ORDER — METOCLOPRAMIDE HCL 5 MG/ML IJ SOLN
20.0000 mg | Freq: Once | INTRAVENOUS | Status: AC
  Administered 2018-08-11: 20 mg via INTRAVENOUS
  Filled 2018-08-11: qty 4

## 2018-08-11 MED ORDER — SODIUM CHLORIDE 0.9 % IV BOLUS
500.0000 mL | Freq: Once | INTRAVENOUS | Status: AC
  Administered 2018-08-11: 500 mL via INTRAVENOUS

## 2018-08-11 NOTE — Discharge Instructions (Signed)
You have been treated with medicines to help relieve your headache pain. Take the prescription meds as directed. Follow-up with your provider for ongoing symptoms.

## 2018-08-11 NOTE — ED Notes (Addendum)
Pt stating hx of migraines with them starting 3 days ago. Pt stating right sided headache extending to her right shoulder. Pt stating light and noise sensitivity. Nurse turned down lights and closed door at time of departure from room.

## 2018-08-11 NOTE — ED Notes (Signed)
Pt is asleep, woken up for DC documents.

## 2018-08-11 NOTE — ED Notes (Signed)
In to give pt meds for HA. Pt asleep. Woke pt up assessed pain, pain 7/10, pt was back asleep prior to meds given

## 2018-08-11 NOTE — ED Provider Notes (Signed)
[facility] Emergency Department Provider Note ____________________________________________  Time seen: [id number]  I have reviewed the triage vital signs and the nursing notes.  HISTORY  Chief Complaint  Headache  HPI Tonya Hendricks is a 53 y.o. female with a history of migraine, who reports significant flare above baseline that warrants ED treatment at least twice a month.  Patient takes Alesia BandaRizzo triptan, Zofran, and topiramate for headache maintenance.  Denies any recent injury, accident, trauma patient also denies any facial weakness, syncope, visual disturbance, or chest pain.  Patient has had this particular migraine flare for the last 2 days.  She reports nausea without vomiting.  Past Medical History:  Diagnosis Date  . Asthma   . Fibromyalgia   . Hypertension   . Migraines     There are no active problems to display for this patient.   Past Surgical History:  Procedure Laterality Date  . CESAREAN SECTION    . NECK SURGERY      Prior to Admission medications   Medication Sig Start Date End Date Taking? Authorizing Provider  losartan (COZAAR) 25 MG tablet Take 25 mg by mouth daily.   Yes [provider]  albuterol (PROVENTIL HFA;VENTOLIN HFA) 108 (90 Base) MCG/ACT inhaler Inhale 1 puff into the lungs every 6 (six) hours as needed for wheezing or shortness of breath.    [provider]  amLODipine (NORVASC) 10 MG tablet Take 10 mg by mouth daily.    [provider]  amoxicillin-clavulanate (AUGMENTIN) 875-125 MG tablet Take 1 tablet by mouth 2 (two) times daily. 04/25/18   Cuthriell, Delorise RoyalsJonathan D, PA-C  azithromycin (ZITHROMAX) 250 MG tablet Take 1 tablet (250 mg total) by mouth daily. 04/20/18   Minna AntisPaduchowski, Kevin, MD  cetirizine (ZYRTEC) 10 MG tablet Take 1 tablet (10 mg total) by mouth daily. 04/25/18   Cuthriell, Delorise RoyalsJonathan D, PA-C  cyclobenzaprine (FLEXERIL) 5 MG tablet Take 5 mg by mouth 3 (three) times daily. 01/24/16   [provider]  DULoxetine (CYMBALTA) 30 MG capsule Take 1 capsule by mouth daily. 09/07/16   [provider]  fluticasone (FLONASE) 50 MCG/ACT nasal spray Place 1 spray into both nostrils 2 (two) times daily. 04/25/18   Cuthriell, Delorise RoyalsJonathan D, PA-C  gabapentin (NEURONTIN) 300 MG capsule Take 300 mg by mouth 3 (three) times daily as needed.    [provider]  hydrochlorothiazide (HYDRODIURIL) 25 MG tablet Take 1 tablet by mouth daily. 06/04/16   [provider]  hydrOXYzine (ATARAX/VISTARIL) 25 MG tablet Take 25 mg by mouth 3 (three) times daily as needed. 09/13/13   [provider]  lovastatin (MEVACOR) 20 MG tablet Take 20 mg by mouth daily. 12/22/16 12/22/17  [provider]  magnesium oxide (MAG-OX) 400 MG tablet Take 1 tablet by mouth daily. 06/03/15   [provider]  meclizine (ANTIVERT) 12.5 MG tablet Take 12.5 mg by mouth 3 (three) times daily. 01/24/16   [provider]  metoCLOPramide (REGLAN) 10 MG tablet Take 1 tablet (10 mg total) by mouth every 8 (eight) hours as needed for nausea or vomiting. 02/13/17 02/23/17  Dionne BucySiadecki, Sebastian, MD  omeprazole (PRILOSEC) 20 MG capsule Take 20 mg by mouth as needed.    [provider]  ondansetron (ZOFRAN) 4 MG tablet Take 1 tablet (4 mg total) by mouth every 8 (eight) hours as needed for nausea or vomiting. Patient not taking: Reported on 02/13/2017 10/02/16   Phineas SemenGoodman, Graydon, MD  oxyCODONE-acetaminophen (ROXICET) 5-325 MG tablet Take 1 tablet  by mouth every 4 (four) hours as needed for severe pain. Patient not taking: Reported on 02/13/2017 10/02/16   Phineas Semen, MD  ranitidine (ZANTAC) 150 MG tablet Take 150 mg by mouth as needed for heartburn.    [provider]    Allergies Promethazine; Shellfish-derived products; Sulfa antibiotics; Sumatriptan succinate; Iodinated diagnostic agents; Lisinopril; Phenergan [promethazine hcl]; Shellfish allergy; Topiramate; and  Aspirin  No family history on file.  Social History Social History   Tobacco Use  . Smoking status: Never Smoker  . Smokeless tobacco: Never Used  Substance Use Topics  . Alcohol use: No  . Drug use: No    Review of Systems  Constitutional: Negative for fever. Eyes: Negative for visual changes. ENT: Negative for sore throat. Cardiovascular: Negative for chest pain. Respiratory: Negative for shortness of breath. Gastrointestinal: Negative for abdominal pain, vomiting and diarrhea. Genitourinary: Negative for dysuria. Musculoskeletal: Negative for back pain. Skin: Negative for rash. Neurological: Negative for focal weakness or numbness.  Reports migraine headache as above. ____________________________________________  PHYSICAL EXAM:  VITAL SIGNS: ED Triage Vitals  Enc Vitals Group     BP 08/11/18 1235 (!) 137/112     Pulse Rate 08/11/18 1235 (!) 104     Resp --      Temp 08/11/18 1235 99.7 F (37.6 C)     Temp Source 08/11/18 1235 Oral     SpO2 08/11/18 1235 97 %     Weight 08/11/18 1236 248 lb (112.5 kg)     Height 08/11/18 1236 5\' 1"  (1.549 m)     Head Circumference --      Peak Flow --      Pain Score 08/11/18 1239 9     Pain Loc --      Pain Edu? --      Excl. in GC? --     Constitutional: Alert and oriented. Well appearing and in no distress. Head: Normocephalic and atraumatic. Eyes: Conjunctivae are normal. Normal extraocular movements Cardiovascular: Normal rate, regular rhythm. Normal distal pulses. Respiratory: Normal respiratory effort. No wheezes/rales/rhonchi. Gastrointestinal: Soft and nontender. No distention. Musculoskeletal: Nontender with normal range of motion in all extremities.  Neurologic:  Normal gait without ataxia. Normal speech and language. No gross focal neurologic deficits are appreciated. Skin:  Skin is warm, dry and intact. No rash noted. Psychiatric: Mood and affect are normal. Patient exhibits appropriate insight and  judgment. ____________________________________________  PROCEDURES  Procedures Benadryl 25 mg IVP x 2 Reglan 20 mg IVPB x 2 Toradol 30 mg IVP Norflex 60 mg IVP NS 500 ml bolus Decadron 10 mg IVP ____________________________________________  INITIAL IMPRESSION / ASSESSMENT AND PLAN / ED COURSE  Patient with ED evaluation of migraine headache with urination for the last 2 to 3 days.  Patient's had no relief with her home medications for prevention and abortion.  She presents now with pain rated at a 10 out of 10.  She describes improvement after medicine administration to a 7 out of 10.  The time of this disposition the patient is discharged, she walks by, smiling and waving, thanking me for her management and treatment.  She will follow-up with her primary provider.  Prescriptions for Reglan, Toradol, and cyclobenzaprine are sent for the patient to use.  She will take over-the-counter Benadryl as needed. ____________________________________________  FINAL CLINICAL IMPRESSION(S) / ED DIAGNOSES  Final diagnoses:  Migraine without aura and without status migrainosus, not intractable      Karmen Stabs, Charlesetta Ivory, PA-C 08/11/18 1911  Phineas SemenGoodman, Gray[place], MD 08/11/18 [phone]

## 2018-08-11 NOTE — ED Triage Notes (Signed)
Says she has migrain for 3 days.  Taking meds from her doctor and they are not working.  Has appt with neurologist in April.

## 2019-08-13 IMAGING — CR DG CHEST 2V
2 series · 2 of 2 positions shown · non-contrast
Comparison: 10/02/2016

CLINICAL DATA: Chest pain

EXAM:
CHEST - 2 VIEW

[chest pa]
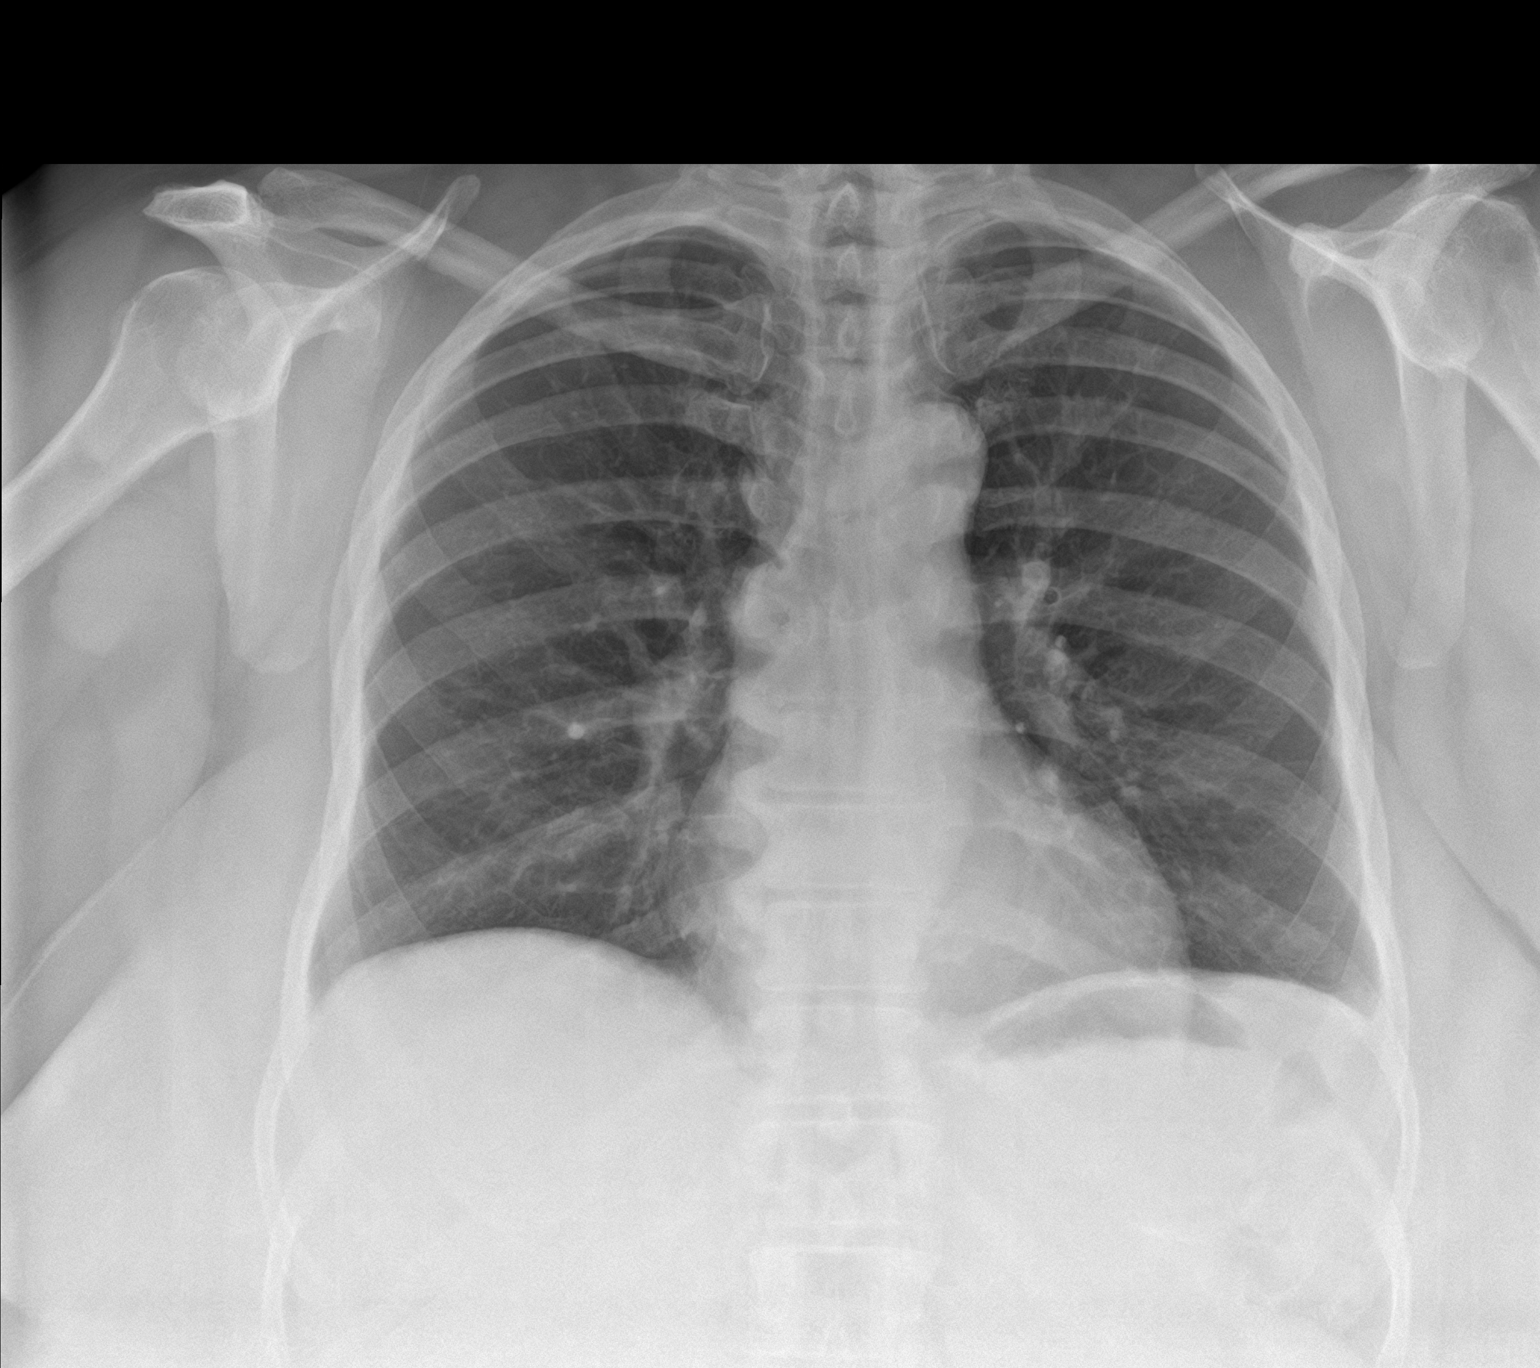

[chest lat]
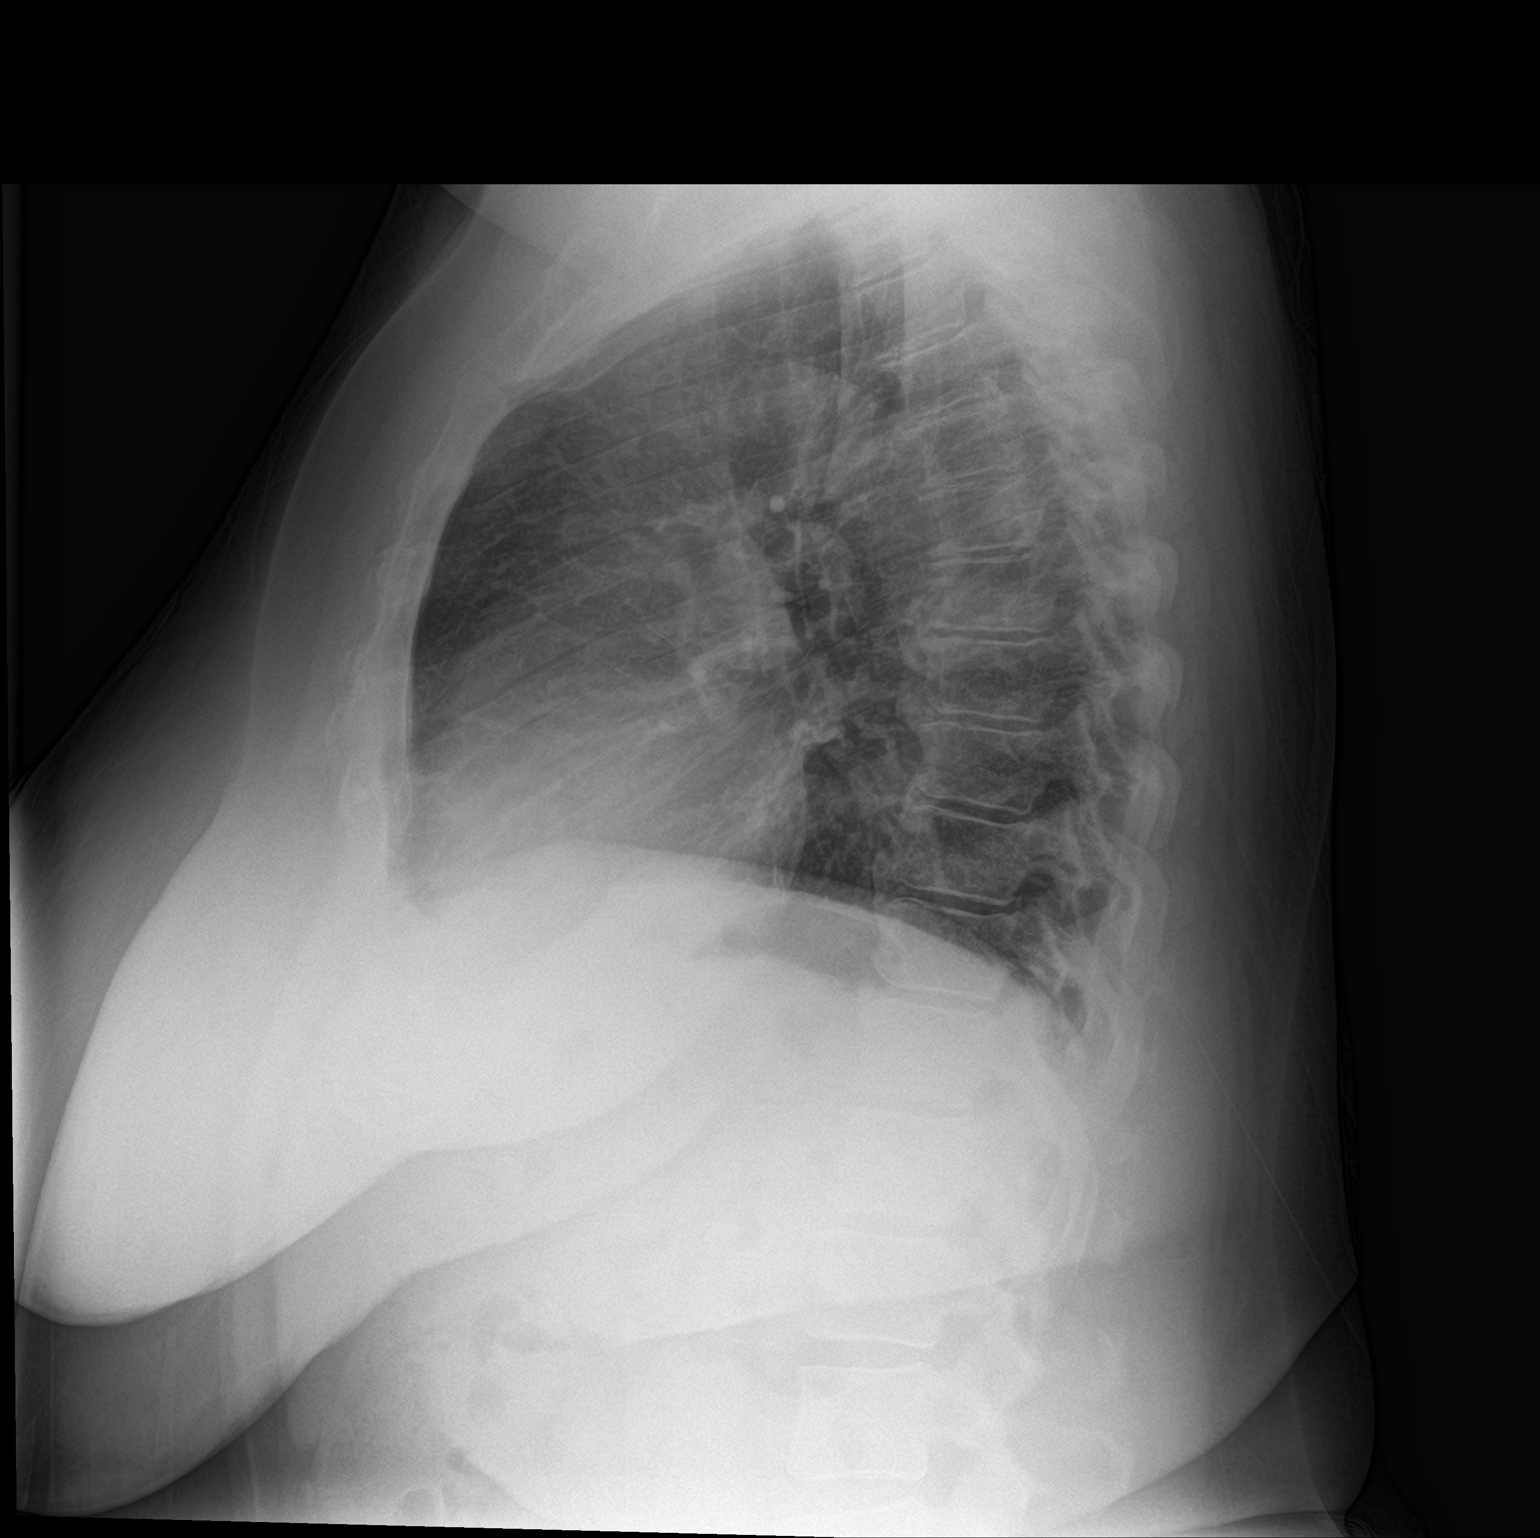

[2 of 2 positions shown; findings below may reference images not displayed]

FINDINGS: The heart size and mediastinal contours are within normal limits.
Both lungs are clear. Degenerative osteophytes of the spine.
IMPRESSION: No active cardiopulmonary disease.

## 2019-12-11 ENCOUNTER — Other Ambulatory Visit: Payer: Self-pay

## 2019-12-11 ENCOUNTER — Emergency Department
Admission: EM | Admit: 2019-12-11 | Discharge: 2019-12-12 | Disposition: A | Discharge status: Home / Self Care | Payer: Medicaid Other | Attending: Emergency Medicine | Admitting: Emergency Medicine

## 2019-12-11 ENCOUNTER — Encounter: Payer: Self-pay | Admitting: Emergency Medicine

## 2019-12-11 DIAGNOSIS — Y999 Unspecified external cause status: Secondary | ICD-10-CM | POA: Insufficient documentation

## 2019-12-11 DIAGNOSIS — Z79899 Other long term (current) drug therapy: Secondary | ICD-10-CM | POA: Insufficient documentation

## 2019-12-11 DIAGNOSIS — Y92410 Unspecified street and highway as the place of occurrence of the external cause: Secondary | ICD-10-CM | POA: Insufficient documentation

## 2019-12-11 DIAGNOSIS — Y93I9 Activity, other involving external motion: Secondary | ICD-10-CM | POA: Insufficient documentation

## 2019-12-11 DIAGNOSIS — S199XXA Unspecified injury of neck, initial encounter: Secondary | ICD-10-CM | POA: Diagnosis present

## 2019-12-11 DIAGNOSIS — S13100A Subluxation of unspecified cervical vertebrae, initial encounter: Secondary | ICD-10-CM | POA: Insufficient documentation

## 2019-12-11 DIAGNOSIS — J45909 Unspecified asthma, uncomplicated: Secondary | ICD-10-CM | POA: Insufficient documentation

## 2019-12-11 DIAGNOSIS — I10 Essential (primary) hypertension: Secondary | ICD-10-CM | POA: Insufficient documentation

## 2019-12-11 MED ORDER — METHOCARBAMOL 750 MG PO TABS: 750.0000 mg | ORAL_TABLET | Freq: Three times a day (TID) | ORAL | 0 refills | Status: AC | PRN

## 2019-12-11 MED ORDER — METHOCARBAMOL 500 MG PO TABS
500.0000 mg | ORAL_TABLET | Freq: Once | ORAL | Status: AC
  Administered 2019-12-11: 500 mg via ORAL
  Filled 2019-12-11: qty 1

## 2019-12-11 NOTE — Discharge Instructions (Signed)
Please follow-up with your primary care provider for symptoms that are not improving over the week.  Return to the emergency department for symptoms that change or worsen if you are unable to schedule an appointment.  Do not drive while taking muscle relaxer.
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
